# Patient Record
Sex: Female | Born: 1985 | Race: White | Hispanic: No | Marital: Married | Smoking: Never smoker
Health system: Southern US, Community
[De-identification: ages and names within clinical notes are randomized; demographics above are authoritative.]

## PROBLEM LIST (undated history)

## (undated) DIAGNOSIS — I341 Nonrheumatic mitral (valve) prolapse: Secondary | ICD-10-CM

## (undated) DIAGNOSIS — O4400 Placenta previa specified as without hemorrhage, unspecified trimester: Secondary | ICD-10-CM

## (undated) HISTORY — DX: Nonrheumatic mitral (valve) prolapse: I34.1

## (undated) HISTORY — PX: WISDOM TOOTH EXTRACTION: SHX21

---
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
## 2003-11-05 ENCOUNTER — Encounter: Admission: RE | Admit: 2003-11-05 | Discharge: 2003-11-05 | Payer: Self-pay | Admitting: *Deleted

## 2003-12-03 ENCOUNTER — Encounter: Admission: RE | Admit: 2003-12-03 | Discharge: 2003-12-03 | Payer: Self-pay | Admitting: *Deleted

## 2003-12-03 ENCOUNTER — Ambulatory Visit (HOSPITAL_COMMUNITY): Admission: RE | Admit: 2003-12-03 | Discharge: 2003-12-03 | Payer: Self-pay | Admitting: *Deleted

## 2006-04-09 ENCOUNTER — Ambulatory Visit: Payer: Self-pay | Admitting: Internal Medicine

## 2008-12-05 DIAGNOSIS — I059 Rheumatic mitral valve disease, unspecified: Secondary | ICD-10-CM

## 2010-01-07 ENCOUNTER — Encounter (INDEPENDENT_AMBULATORY_CARE_PROVIDER_SITE_OTHER): Payer: Self-pay | Admitting: *Deleted

## 2010-02-21 ENCOUNTER — Ambulatory Visit: Payer: Self-pay | Admitting: Internal Medicine

## 2010-09-30 NOTE — Letter (Signed)
Summary: Appointment - Missed  [facility], Main Office  [address]   [place], [place] [address]   Phone: [phone]  Fax: [phone]     Jan 07, 2010 MRN: [id number]   [facility] Barron [address] [place], [place]  [address]   Dear Traci Barron,  Our records indicate you missed your appointment on 01/03/2010 with Dr.  Tenny Craw. It is very important that we reach you to reschedule this appointment. We look forward to participating in your health care needs. Please contact us at the number listed above at your earliest convenience to reschedule this appointment.     Sincerely,   Traci Barron [facility] Scheduling Team

## 2010-09-30 NOTE — Assessment & Plan Note (Signed)
Summary: f3y   History of Present Illness: Patient is a 25 year old with a history of mitral valve prolapse.  I last saw her in 2007. The patient has done well since.  SHe denies palpitations.  No shortness of breath.  No chest pains.   Current Medications (verified): 1)  Birth Control  Allergies (verified): No Known Drug Allergies  Past History:  Past Medical History: Last updated: 03/14/2009 Current Problems:  MITRAL VALVE PROLAPSE (ICD-424.0)  Review of Systems       All systems reviewed.  Negatvie to the above problem.  Vital Signs:  Patient profile:   25 year old female Weight:      180 pounds Pulse rate:   90 / minute BP sitting:   121 / 76  (left arm) Cuff size:   regular  Vitals Entered By: Burnett Kanaris, CNA (February 21, 2010 4:35 PM)  Physical Exam  Additional Exam:  Patient is in NAD HEENT:  Normocephalic, atraumatic. EOMI, PERRLA.  Neck: JVP is normal. No thyromegaly. No bruits.  Lungs: clear to auscultation. No rales no wheezes.  Heart: Regular rate and rhythm. Normal S1, S2. No S3.   No  murmur. Positive clicks.  Abdomen:  Supple, nontender. Normal bowel sounds. No masses. No hepatomegaly.  Extremities:   Good distal pulses throughout. No lower extremity edema.  Musculoskeletal :moving all extremities.  Neuro:   alert and oriented x3.    EKG  Procedure date:  02/21/2010  Findings:      Ectopic atrial rhythm.  72 bpm.  Impression & Recommendations:  Problem # 1:  MITRAL VALVE PROLAPSE (ICD-424.0) Patient with no murmur to suggest MR.  I would not recommend echocardigram. Instead, revommend continuing to follow physical exam. I encouraged patient to stay active.  In regards to ABX prophylaxis, the ACC no longer recommends it in patients with MVP. Reianna  is normaly followed at [facility] clinc by Dr. Almedia Balls.  She will continue to follow up with him.  If her exam changes and she develops a murmur would reevaluate with echo thne..  Will be  available as needed.  Follow up is as needed.

## 2011-01-16 NOTE — Assessment & Plan Note (Signed)
[facility]                              CARDIOLOGY OFFICE NOTE   ANDROMEDA, POPPEN                       MRN:          [id number]  DATE:04/09/2006                            DOB:          12-14-1985    IDENTIFICATION:  Ms. Traci Barron is a young woman who was previously followed by  Elsie Stain, MD, in pediatric cardiology clinic.  She has a history of  mild mitral valve prolapse.  She was last seen in the clinic back in April  of 2005.  Note echocardiogram  prior to this showed again diagnosis of mild  mitral valve prolapse with trivial regurgitation.  Not clear on the date of  this.  Physical examination at the time showed multiple clicks.  In the  interval, the patient has done well.  She is active, denies palpitations, no  shortness of breath, no chest pain.   CURRENT MEDICATIONS:  None.   PAST MEDICAL HISTORY:  Mild MVP.   REVIEW OF SYSTEMS:  Negative.   PHYSICAL EXAMINATION:  GENERAL APPEARANCE:  Patient is in no distress.  VITAL SIGNS:  Blood pressure 122/84, pulse 67, weight 178.  NECK:  JVP is normal.  LUNGS:  Clear.  CARDIOVASCULAR:  Regular rate and rhythm.  S1 and S2.  No S3 or S4.  Again,  clicks intermittently heard at the apex.  ABDOMEN:  Benign.  EXTREMITIES:  Good distal pulses, equal onset, no edema.   A 12-lead EKG normal sinus rhythm, T-wave inversion in the inferolateral  leads and unchanged from previous.   IMPRESSION:  Mitral valve prolapse minimal, mild by exam, no evidence of  regurgitation.   I would like to review the echocardiogram  that she had done at the  hospital.  She has been taking antibiotic prophylaxis.  I will be in touch  with her once I have reviewed it regarding continued use.   Otherwise tentatively follow up in two years' time, sooner if problems  develop.   Will, at some point, need to get a fasting lipid panel for health care  maintenance.                               Pricilla Riffle,  MD, [facility]    PVR/MedQ  DD:  04/11/2006  DT:  04/12/2006  Job #:  [id number]

## 2015-10-07 ENCOUNTER — Encounter: Payer: Self-pay | Admitting: Internal Medicine

## 2015-11-07 ENCOUNTER — Encounter: Payer: Self-pay | Admitting: Internal Medicine

## 2015-12-17 ENCOUNTER — Encounter: Payer: Self-pay | Admitting: *Deleted

## 2015-12-20 ENCOUNTER — Encounter: Payer: Self-pay | Admitting: Internal Medicine

## 2015-12-24 ENCOUNTER — Encounter: Payer: Self-pay | Admitting: Internal Medicine

## 2018-12-05 ENCOUNTER — Encounter: Payer: Self-pay | Admitting: Obstetrics & Gynecology

## 2018-12-05 DIAGNOSIS — Z3490 Encounter for supervision of normal pregnancy, unspecified, unspecified trimester: Secondary | ICD-10-CM

## 2018-12-05 DIAGNOSIS — O0990 Supervision of high risk pregnancy, unspecified, unspecified trimester: Secondary | ICD-10-CM | POA: Insufficient documentation

## 2018-12-08 ENCOUNTER — Encounter: Payer: Self-pay | Admitting: Obstetrics & Gynecology

## 2018-12-08 ENCOUNTER — Other Ambulatory Visit: Payer: Self-pay

## 2018-12-08 ENCOUNTER — Ambulatory Visit (INDEPENDENT_AMBULATORY_CARE_PROVIDER_SITE_OTHER): Payer: PRIVATE HEALTH INSURANCE | Admitting: Obstetrics & Gynecology

## 2018-12-08 DIAGNOSIS — Z1151 Encounter for screening for human papillomavirus (HPV): Secondary | ICD-10-CM

## 2018-12-08 DIAGNOSIS — Z124 Encounter for screening for malignant neoplasm of cervix: Secondary | ICD-10-CM | POA: Diagnosis not present

## 2018-12-08 DIAGNOSIS — O99211 Obesity complicating pregnancy, first trimester: Secondary | ICD-10-CM | POA: Diagnosis not present

## 2018-12-08 DIAGNOSIS — Z3A09 9 weeks gestation of pregnancy: Secondary | ICD-10-CM | POA: Diagnosis not present

## 2018-12-08 DIAGNOSIS — Z23 Encounter for immunization: Secondary | ICD-10-CM

## 2018-12-08 DIAGNOSIS — Z113 Encounter for screening for infections with a predominantly sexual mode of transmission: Secondary | ICD-10-CM

## 2018-12-08 DIAGNOSIS — Z3490 Encounter for supervision of normal pregnancy, unspecified, unspecified trimester: Secondary | ICD-10-CM

## 2018-12-08 DIAGNOSIS — O99210 Obesity complicating pregnancy, unspecified trimester: Secondary | ICD-10-CM | POA: Insufficient documentation

## 2018-12-08 DIAGNOSIS — O09521 Supervision of elderly multigravida, first trimester: Secondary | ICD-10-CM

## 2018-12-08 DIAGNOSIS — O09529 Supervision of elderly multigravida, unspecified trimester: Secondary | ICD-10-CM | POA: Insufficient documentation

## 2018-12-08 NOTE — Progress Notes (Signed)
Bedside U/S shows single IUP with FHT of 189 BPM and CRL is 24.30 mm  GA is [redacted]w[redacted]d

## 2018-12-08 NOTE — Progress Notes (Signed)
  Subjective:    Traci Barron is being seen today for her first obstetrical visit.  This is a planned pregnancy. She is at [redacted]w[redacted]d gestation. Her obstetrical history is significant for advanced maternal age and obesity. Relationship with FOB: spouse, living together. Patient does intend to breast feed. Pregnancy history fully reviewed.  Patient reports no complaints.  Review of Systems:   Review of Systems  Objective:     BP 116/67   Pulse 80   Ht 5\' 6"  (1.676 m)   Wt 227 lb (103 kg)   LMP 09/27/2018   BMI 36.64 kg/m  Physical Exam  Exam Breathing, conversing, and ambulating normally Well nourished, well hydrated White female, no apparent distress Heart- rrr Breast- normal bilaterally Lungs- CTAB Abd- benign Speculum exam- normal vulva, vagina, cervix   Assessment:    Pregnancy: G1P0 Patient Active Problem List   Diagnosis Date Noted  . Obesity in pregnancy 12/08/2018  . AMA (advanced maternal age) multigravida 35+ 12/08/2018  . Supervision of normal pregnancy 12/05/2018  . MITRAL VALVE PROLAPSE 12/05/2008       Plan:     Initial labs drawn. Prenatal vitamins. Problem list reviewed and updated. NIPS/horizon today Role of ultrasound in pregnancy discussed; fetal survey: ordered. Amniocentesis discussed: not indicated. Follow up in 4 weeks. Flu vaccine Baby scripts, OPX Pap with cotesting Discussed weight gain, she declines a consult with a dietician.   Allie Bossier 12/08/2018

## 2018-12-09 LAB — OBSTETRIC PANEL
Absolute Monocytes: 644 cells/uL (ref 200–950)
Antibody Screen: NOT DETECTED
Basophils Absolute: 20 cells/uL (ref 0–200)
Basophils Relative: 0.2 %
Eosinophils Absolute: 69 cells/uL (ref 15–500)
Eosinophils Relative: 0.7 %
HCT: 40.3 % (ref 35.0–45.0)
Hemoglobin: 14.3 g/dL (ref 11.7–15.5)
Hepatitis B Surface Ag: NONREACTIVE
Lymphs Abs: 1366 cells/uL (ref 850–3900)
MCH: 32.6 pg (ref 27.0–33.0)
MCHC: 35.5 g/dL (ref 32.0–36.0)
MCV: 91.8 fL (ref 80.0–100.0)
MPV: 9.8 fL (ref 7.5–12.5)
Monocytes Relative: 6.5 %
Neutro Abs: 7801 cells/uL — ABNORMAL HIGH (ref 1500–7800)
Neutrophils Relative %: 78.8 %
Platelets: 278 10*3/uL (ref 140–400)
RBC: 4.39 10*6/uL (ref 3.80–5.10)
RDW: 11.7 % (ref 11.0–15.0)
RPR Ser Ql: NONREACTIVE
Rubella: 3.23 index
Total Lymphocyte: 13.8 %
WBC: 9.9 10*3/uL (ref 3.8–10.8)

## 2018-12-09 LAB — COMPREHENSIVE METABOLIC PANEL
AG Ratio: 1.8 (calc) (ref 1.0–2.5)
ALT: 14 U/L (ref 6–29)
AST: 17 U/L (ref 10–30)
Albumin: 4.3 g/dL (ref 3.6–5.1)
Alkaline phosphatase (APISO): 44 U/L (ref 31–125)
BUN: 8 mg/dL (ref 7–25)
CO2: 23 mmol/L (ref 20–32)
Calcium: 9.4 mg/dL (ref 8.6–10.2)
Chloride: 103 mmol/L (ref 98–110)
Creat: 0.71 mg/dL (ref 0.50–1.10)
Globulin: 2.4 g/dL (calc) (ref 1.9–3.7)
Glucose, Bld: 83 mg/dL (ref 65–99)
Potassium: 3.8 mmol/L (ref 3.5–5.3)
Sodium: 136 mmol/L (ref 135–146)
Total Bilirubin: 0.6 mg/dL (ref 0.2–1.2)
Total Protein: 6.7 g/dL (ref 6.1–8.1)

## 2018-12-09 LAB — PROTEIN / CREATININE RATIO, URINE
Creatinine, Urine: 190 mg/dL (ref 20–275)
Protein/Creat Ratio: 84 mg/g creat (ref 21–161)
Protein/Creatinine Ratio: 0.084 mg/mg creat (ref 0.021–0.16)
Total Protein, Urine: 16 mg/dL (ref 5–24)

## 2018-12-09 LAB — HIV ANTIBODY (ROUTINE TESTING W REFLEX): HIV 1&2 Ab, 4th Generation: NONREACTIVE

## 2018-12-09 LAB — HEMOGLOBIN A1C
Hgb A1c MFr Bld: 4.7 % of total Hgb (ref <5.7)
Mean Plasma Glucose: 88 (calc)
eAG (mmol/L): 4.9 (calc)

## 2018-12-10 LAB — URINE CULTURE, OB REFLEX

## 2018-12-10 LAB — CULTURE, OB URINE

## 2018-12-13 LAB — CYTOLOGY - PAP
Chlamydia: NEGATIVE
Diagnosis: NEGATIVE
HPV: NOT DETECTED
Neisseria Gonorrhea: NEGATIVE

## 2019-01-05 ENCOUNTER — Ambulatory Visit (INDEPENDENT_AMBULATORY_CARE_PROVIDER_SITE_OTHER): Payer: PRIVATE HEALTH INSURANCE | Admitting: Obstetrics & Gynecology

## 2019-01-05 ENCOUNTER — Other Ambulatory Visit: Payer: Self-pay

## 2019-01-05 VITALS — BP 126/80 | HR 70 | Wt 226.0 lb

## 2019-01-05 DIAGNOSIS — I059 Rheumatic mitral valve disease, unspecified: Secondary | ICD-10-CM

## 2019-01-05 DIAGNOSIS — O09521 Supervision of elderly multigravida, first trimester: Secondary | ICD-10-CM

## 2019-01-05 DIAGNOSIS — O09522 Supervision of elderly multigravida, second trimester: Secondary | ICD-10-CM

## 2019-01-05 DIAGNOSIS — O99211 Obesity complicating pregnancy, first trimester: Secondary | ICD-10-CM

## 2019-01-05 DIAGNOSIS — Z3402 Encounter for supervision of normal first pregnancy, second trimester: Secondary | ICD-10-CM

## 2019-01-05 DIAGNOSIS — O99210 Obesity complicating pregnancy, unspecified trimester: Secondary | ICD-10-CM

## 2019-01-05 NOTE — Progress Notes (Signed)
   PRENATAL VISIT NOTE  Subjective:  Traci Barron is a 33 y.o. G1P0 at [redacted]w[redacted]d being seen today for ongoing prenatal care.  She is currently monitored for the following issues for this low-risk pregnancy and has Mitral valve disease; Supervision of normal pregnancy; Obesity in pregnancy; and AMA (advanced maternal age) multigravida 35+ on their problem list.  Patient reports no complaints.  Contractions: Not present. Vag. Bleeding: None.  Movement: Absent. Denies leaking of fluid.   The following portions of the patient's history were reviewed and updated as appropriate: allergies, current medications, past family history, past medical history, past social history, past surgical history and problem list.   Objective:   Vitals:   01/05/19 1528  BP: 126/80  Pulse: 70  Weight: 226 lb (102.5 kg)    Fetal Status: Fetal Heart Rate (bpm): 159   Movement: Absent     General:  Alert, oriented and cooperative. Patient is in no acute distress.  Skin: Skin is warm and dry. No rash noted.   Cardiovascular: Normal heart rate noted  Respiratory: Normal respiratory effort, no problems with respiration noted  Abdomen: Soft, gravid, appropriate for gestational age.  Pain/Pressure: Absent     Pelvic: Cervical exam deferred        Extremities: Normal range of motion.  Edema: None  Mental Status: Normal mood and affect. Normal behavior. Normal judgment and thought content.   Assessment and Plan:  Pregnancy: G1P0 at [redacted]w[redacted]d 1. Multigravida of advanced maternal age in second trimester   2. Obesity in pregnancy - rec less than 20 pounds  3. Encounter for supervision of normal first pregnancy in second trimester   4. Mitral valve disease - asymptomatic, discovered during exam for cheerleading in high school  Preterm labor symptoms and general obstetric precautions including but not limited to vaginal bleeding, contractions, leaking of fluid and fetal movement were reviewed in detail with the patient.  Please refer to After Visit Summary for other counseling recommendations.   No follow-ups on file.  Future Appointments  Date Time Provider Department Center  02/20/2019  8:15 AM [facility]-M[facility] [place] 4 WH-MFCUS M[facility]-US    Allie Bossier, MD

## 2019-01-16 ENCOUNTER — Encounter: Payer: Self-pay | Admitting: *Deleted

## 2019-01-16 DIAGNOSIS — Z3402 Encounter for supervision of normal first pregnancy, second trimester: Secondary | ICD-10-CM

## 2019-02-20 ENCOUNTER — Other Ambulatory Visit: Payer: Self-pay | Admitting: Obstetrics & Gynecology

## 2019-02-20 ENCOUNTER — Ambulatory Visit (HOSPITAL_COMMUNITY)
Admission: RE | Admit: 2019-02-20 | Discharge: 2019-02-20 | Disposition: A | Discharge status: Home / Self Care | Payer: PRIVATE HEALTH INSURANCE | Source: Ambulatory Visit | Attending: Obstetrics and Gynecology | Admitting: Obstetrics and Gynecology

## 2019-02-20 ENCOUNTER — Other Ambulatory Visit: Payer: Self-pay

## 2019-02-20 ENCOUNTER — Other Ambulatory Visit (HOSPITAL_COMMUNITY): Payer: Self-pay | Admitting: *Deleted

## 2019-02-20 DIAGNOSIS — Z362 Encounter for other antenatal screening follow-up: Secondary | ICD-10-CM

## 2019-02-20 DIAGNOSIS — O99412 Diseases of the circulatory system complicating pregnancy, second trimester: Secondary | ICD-10-CM | POA: Diagnosis not present

## 2019-02-20 DIAGNOSIS — Z3A19 19 weeks gestation of pregnancy: Secondary | ICD-10-CM

## 2019-02-20 DIAGNOSIS — O289 Unspecified abnormal findings on antenatal screening of mother: Secondary | ICD-10-CM

## 2019-02-20 DIAGNOSIS — Z3490 Encounter for supervision of normal pregnancy, unspecified, unspecified trimester: Secondary | ICD-10-CM | POA: Diagnosis not present

## 2019-02-20 DIAGNOSIS — O3412 Maternal care for benign tumor of corpus uteri, second trimester: Secondary | ICD-10-CM | POA: Diagnosis not present

## 2019-02-20 DIAGNOSIS — O99212 Obesity complicating pregnancy, second trimester: Secondary | ICD-10-CM | POA: Diagnosis not present

## 2019-02-20 DIAGNOSIS — O09522 Supervision of elderly multigravida, second trimester: Secondary | ICD-10-CM | POA: Diagnosis not present

## 2019-02-20 DIAGNOSIS — O4402 Placenta previa specified as without hemorrhage, second trimester: Secondary | ICD-10-CM

## 2019-02-23 ENCOUNTER — Telehealth (INDEPENDENT_AMBULATORY_CARE_PROVIDER_SITE_OTHER): Payer: PRIVATE HEALTH INSURANCE | Admitting: Obstetrics & Gynecology

## 2019-02-23 DIAGNOSIS — Z3402 Encounter for supervision of normal first pregnancy, second trimester: Secondary | ICD-10-CM

## 2019-02-23 DIAGNOSIS — O99210 Obesity complicating pregnancy, unspecified trimester: Secondary | ICD-10-CM

## 2019-02-23 DIAGNOSIS — Z3A20 20 weeks gestation of pregnancy: Secondary | ICD-10-CM

## 2019-02-23 MED ORDER — SODIUM CHLORIDE 0.9 % IV SOLN
10.00 | INTRAVENOUS | Status: DC
Start: ? — End: 2019-02-23

## 2019-02-23 MED ORDER — GENERIC EXTERNAL MEDICATION
Status: DC
Start: ? — End: 2019-02-23

## 2019-02-23 NOTE — Progress Notes (Signed)
TELEHEALTH OBSTETRICS PRENATAL VIRTUAL VIDEO VISIT ENCOUNTER NOTE  [facility] [place]   I connected with Traci Barron on 02/23/19 at  2:15 PM EDT by MyChart Video Encounter at home and verified that I am speaking with the correct person using two identifiers.   I discussed the limitations, risks, security and privacy concerns of performing an evaluation and management service by telephone and the availability of in person appointments. I also discussed with the patient that there may be a patient responsible charge related to this service. The patient expressed understanding and agreed to proceed. Subjective:  Traci LollLyndsey Mich is a 33 y.o. G1P0 at 7230w1d being seen today for ongoing prenatal care.  She is currently monitored for the following issues for this low-risk pregnancy and has Mitral valve disease; Supervision of normal pregnancy; Obesity in pregnancy; and AMA (advanced maternal age) multigravida 35+ on their problem list.  Patient reports no complaints.  Contractions: Not present. Vag. Bleeding: None.  Movement: Absent. Denies any leaking of fluid.   The following portions of the patient's history were reviewed and updated as appropriate: allergies, current medications, past family history, past medical history, past social history, past surgical history and problem list.   Objective:   Vitals:   02/23/19 1430  BP: 120/63  Weight: 227 lb (103 kg)    Fetal Status:     Movement: Absent     General:  Alert, oriented and cooperative. Patient is in no acute distress.  Respiratory: Normal respiratory effort, no problems with respiration noted  Mental Status: Normal mood and affect. Normal behavior. Normal judgment and thought content.  Rest of physical exam deferred due to type of encounter  Imaging: [place] Mfm Ob Detail +14 Wk  Result Date: 02/20/2019 ----------------------------------------------------------------------  OBSTETRICS  REPORT                       (Signed Final 02/20/2019 10:12 am) ---------------------------------------------------------------------- Patient Info  ID #:       [id number]                          D.O.B.:  [birth year] (32 yrs)  Name:       Traci Barron                   Visit Date: 02/20/2019 08:08 am ---------------------------------------------------------------------- Performed By  Performed By:     Rennie PlowmanErica Lyskawa          Ref. Address:     [address]                    [address]                                                             [address]                                                             [place], [place]  [id number]  Attending:        Noralee Spaceavi Shankar MD        [facility]  Referred By:      Allie BossierMYRA C Issaic Welliver MD ---------------------------------------------------------------------- Orders   #  Description                          Code         Ordered By   1  US MFM OB DETAIL +14 WK              76811.01     Annaliesa Blann  ----------------------------------------------------------------------   #  Order #                    Accession #                 Episode #   1  [id number]                  [id number]                  [id number]  ---------------------------------------------------------------------- Indications   [redacted] weeks gestation of pregnancy                Z3A.19   Encounter for antenatal screening for          Z36.3   malformations (NIPS, low-risk female)   Obesity complicating pregnancy, second         72O99.212   trimester   Advanced maternal age multigravida 5335+,        89O09.522   second trimester   Heart disease in mother affecting pregnancy    O99.412   in second trimester (mitral valve prolapse)   Uterine fibroids                               O34.10   Abnormal finding on antenatal screening        O28.9   (shortened long bones)   Placenta previa specified as  without           O44.02   hemorrhage, second trimester  ---------------------------------------------------------------------- Vital Signs  Weight (lb): 216                               Height:        5'6"  BMI:         34.86 ---------------------------------------------------------------------- Fetal Evaluation  Num Of Fetuses:         1  Fetal Heart Rate(bpm):  151  Cardiac Activity:       Observed  Presentation:           Breech  Placenta:               Posterior Previa  P. Cord Insertion:      Visualized, central  Amniotic Fluid  AFI FV:      Within normal limits  Largest Pocket(cm)                              5.64 ---------------------------------------------------------------------- Biometry  BPD:      42.9  mm     G. Age:  19w 0d         21  %    CI:        70.13   %    70 - 86                                                          FL/HC:      14.7   %    16.8 - 19.8  HC:      163.4  mm     G. Age:  19w 1d         17  %    HC/AC:      1.19        1.09 - 1.39  AC:      137.7  mm     G. Age:  19w 1d         28  %    FL/BPD:     55.9   %  FL:         24  mm     G. Age:  17w 1d        < 3  %    FL/AC:      17.4   %    20 - 24  HUM:        24  mm     G. Age:  17w 3d        < 5  %  CER:        20  mm     G. Age:  19w 0d         36  %  NFT:       4.1  mm  LV:          7  mm  CM:        5.3  mm  ULN:      19.7  mm     G. Age:  16w 6d        < 5  %  TIB:      21.2  mm     G. Age:  17w 4d        < 5  %  RAD:      12.9  mm     G. Age:  14w 0d        < 5  %  FIB:      19.4  mm     G. Age:  16w 3d         13  %  Est. FW:     237  gm      0 lb 8 oz   < 25  % ---------------------------------------------------------------------- OB History  Gravidity:    1         Term:   0        Prem:   0        SAB:   0  TOP:  0       Ectopic:  0        Living: 0 ---------------------------------------------------------------------- Gestational Age  LMP:           20w 6d        Date:  09/27/18                  EDD:   07/04/19  U/S Today:     18w 4d                                        EDD:   07/20/19  Best:          19w 5d     Det. ByLoman Chroman         EDD:   07/12/19                                      (12/08/18) ---------------------------------------------------------------------- Anatomy  Cranium:               Appears normal         LVOT:                   Not well visualized  Cavum:                 Not well visualized    Aortic Arch:            Not well visualized  Ventricles:            Appears normal         Ductal Arch:            Not well visualized  Choroid Plexus:        Appears normal         Diaphragm:              Appears normal  Cerebellum:            Appears normal         Stomach:                Appears normal, left                                                                        sided  Posterior Fossa:       Appears normal         Abdomen:                Appears normal  Nuchal Fold:           Appears normal         Abdominal Wall:         Appears nml (cord  insert, abd wall)  Face:                  Appears normal         Cord Vessels:           Appears normal (3                         (orbits and profile)                           vessel cord)  Lips:                  Appears normal         Kidneys:                Appear normal  Palate:                Appears normal         Bladder:                Appears normal  Thoracic:              Appears normal         Spine:                  Ltd views no                                                                        intracranial signs of                                                                        NTD  Heart:                 Not well visualized    Upper Extremities:      Appears normal  RVOT:                  Not well visualized    Lower Extremities:      Appears normal  Other:  Heels and 5th digit visualized. Nasal bone visualized. Fetus appears           to be female. Technically difficult due to fetal position. ---------------------------------------------------------------------- Cervix Uterus Adnexa  Cervix  Length:            4.7  cm.  Normal appearance by transabdominal scan.  Left Ovary  Not visualized.  Right Ovary  Not visualized.  Adnexa  No abnormality visualized. ---------------------------------------------------------------------- Myomas   Site                     L(cm)      W(cm)      D(cm)      Location   Posterior right          6.38       5.9  6.46   Posterior ML             5.98       5.09       4.69   Left lateral             5.05       3.21       3.29  ----------------------------------------------------------------------   Blood Flow                 RI        PI       Comments  ---------------------------------------------------------------------- Impression  Ms. Severe, G1 P0, is here for fetal anatomy scan. On cell-free  fetal DNA screening, the risks of fetal aneuploidies are not  increased (from your note in EPIC).  Patient has mitral valve prolapse and does not have  cardiovascular symptoms.  Her pregnancy is dated by 9-week ultrasound performed at  your office (more than 8 days from her LMP date).  We performed a fetal anatomy scan. No markers of  aneuploidies or fetal structural defects are seen. Fetal  biometry lags gestational age by 8 days and the long bones  (humerus and femur lag gestational age by 2 weeks). The  skeletal system including spine and calvarium look normal.  Bones are well mineralized. The chest appears normal.   Amniotic fluid is normal and good fetal activity is seen.  Placenta previa is seen. 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  I explained that the finding of short long bones is more-likely  to be constitutional. Incorrect gestational age is unlikely when  it is dated by 9-week ultrasound. We, however, do not have  ultrasound images to verify accuracy of the images (bedside).  I reassured her  that placenta previa can resolve in many  cases as the pregnancy advances. I did not recommend  abstinence from sexual intercourse in the absence of vaginal  bleeding.  Patient understands the limitations of ultrasound in detecting  fetal anomalies. ---------------------------------------------------------------------- Recommendations  An appointment was made for her to return in 4 weeks for  completion of fetal anatomy. ----------------------------------------------------------------------                  Noralee Space, MD Electronically Signed Final Report   02/20/2019 10:12 am ----------------------------------------------------------------------   Assessment and Plan:  Pregnancy: G1P0 at [redacted]w[redacted]d 1. Obesity in pregnancy  2. Encounter for supervision of normal first pregnancy in second trimester - She went to [facility] for cramping last week, reassurance was given by them, she was given macrobid for a mild UTI, she is feeling better now - PTL precautions given  Preterm labor symptoms and general obstetric precautions including but not limited to vaginal bleeding, contractions, leaking of fluid and fetal movement were reviewed in detail with the patient. I discussed the assessment and treatment plan with the patient. The patient was provided an opportunity to ask questions and all were answered. The patient agreed with the plan and demonstrated an understanding of the instructions. The patient was advised to call back or seek an in-person office evaluation/go to MAU at [facility] for any urgent or concerning symptoms. Please refer to After Visit Summary for other counseling recommendations.   I provided 10 minutes of face-to-face time during this encounter.  No follow-ups on file.  Future Appointments  Date Time Provider Department Center  03/09/2019  3:15 PM Allie Bossier, MD [facility]  03/20/2019  7:45 AM WH-MFC NURSE [facility]-MFC M[facility]-US  03/20/2019  7:45 AM WH-MFC [place] 2  [facility] M[facility]-US    Allie Bossier, MD Center for [employer], [facility]

## 2019-02-23 NOTE — Progress Notes (Signed)
PT c/o cramping Pt recently around someone who tested positive for COVID

## 2019-03-08 ENCOUNTER — Encounter

## 2019-03-09 ENCOUNTER — Encounter: Payer: PRIVATE HEALTH INSURANCE | Admitting: Obstetrics & Gynecology

## 2019-03-20 ENCOUNTER — Other Ambulatory Visit (HOSPITAL_COMMUNITY): Payer: Self-pay | Admitting: *Deleted

## 2019-03-20 ENCOUNTER — Encounter (HOSPITAL_COMMUNITY): Payer: Self-pay

## 2019-03-20 ENCOUNTER — Ambulatory Visit (HOSPITAL_COMMUNITY): Payer: PRIVATE HEALTH INSURANCE | Admitting: *Deleted

## 2019-03-20 ENCOUNTER — Ambulatory Visit (HOSPITAL_COMMUNITY)
Admission: RE | Admit: 2019-03-20 | Discharge: 2019-03-20 | Disposition: A | Discharge status: Home / Self Care | Payer: PRIVATE HEALTH INSURANCE | Source: Ambulatory Visit | Attending: Obstetrics and Gynecology | Admitting: Obstetrics and Gynecology

## 2019-03-20 ENCOUNTER — Other Ambulatory Visit: Payer: Self-pay

## 2019-03-20 VITALS — BP 118/74 | HR 81 | Temp 98.9°F

## 2019-03-20 DIAGNOSIS — O99210 Obesity complicating pregnancy, unspecified trimester: Secondary | ICD-10-CM

## 2019-03-20 DIAGNOSIS — O99212 Obesity complicating pregnancy, second trimester: Secondary | ICD-10-CM

## 2019-03-20 DIAGNOSIS — Z3A23 23 weeks gestation of pregnancy: Secondary | ICD-10-CM

## 2019-03-20 DIAGNOSIS — O4402 Placenta previa specified as without hemorrhage, second trimester: Secondary | ICD-10-CM

## 2019-03-20 DIAGNOSIS — O09523 Supervision of elderly multigravida, third trimester: Secondary | ICD-10-CM

## 2019-03-20 DIAGNOSIS — O09522 Supervision of elderly multigravida, second trimester: Secondary | ICD-10-CM

## 2019-03-20 DIAGNOSIS — O99412 Diseases of the circulatory system complicating pregnancy, second trimester: Secondary | ICD-10-CM

## 2019-03-20 DIAGNOSIS — O289 Unspecified abnormal findings on antenatal screening of mother: Secondary | ICD-10-CM

## 2019-03-20 DIAGNOSIS — Z362 Encounter for other antenatal screening follow-up: Secondary | ICD-10-CM

## 2019-03-20 DIAGNOSIS — O3412 Maternal care for benign tumor of corpus uteri, second trimester: Secondary | ICD-10-CM

## 2019-03-24 ENCOUNTER — Other Ambulatory Visit: Payer: Self-pay

## 2019-03-24 ENCOUNTER — Encounter: Payer: Self-pay | Admitting: Certified Nurse Midwife

## 2019-03-24 ENCOUNTER — Telehealth (INDEPENDENT_AMBULATORY_CARE_PROVIDER_SITE_OTHER): Payer: PRIVATE HEALTH INSURANCE | Admitting: Certified Nurse Midwife

## 2019-03-24 VITALS — BP 110/76 | HR 82 | Wt 233.0 lb

## 2019-03-24 DIAGNOSIS — O4400 Placenta previa specified as without hemorrhage, unspecified trimester: Secondary | ICD-10-CM

## 2019-03-24 DIAGNOSIS — O283 Abnormal ultrasonic finding on antenatal screening of mother: Secondary | ICD-10-CM

## 2019-03-24 DIAGNOSIS — I059 Rheumatic mitral valve disease, unspecified: Secondary | ICD-10-CM

## 2019-03-24 DIAGNOSIS — O4402 Placenta previa specified as without hemorrhage, second trimester: Secondary | ICD-10-CM

## 2019-03-24 DIAGNOSIS — I341 Nonrheumatic mitral (valve) prolapse: Secondary | ICD-10-CM

## 2019-03-24 DIAGNOSIS — O26892 Other specified pregnancy related conditions, second trimester: Secondary | ICD-10-CM

## 2019-03-24 DIAGNOSIS — Z3A24 24 weeks gestation of pregnancy: Secondary | ICD-10-CM

## 2019-03-24 DIAGNOSIS — Z3402 Encounter for supervision of normal first pregnancy, second trimester: Secondary | ICD-10-CM

## 2019-03-24 HISTORY — DX: Complete placenta previa nos or without hemorrhage, unspecified trimester: O44.00

## 2019-03-24 NOTE — Patient Instructions (Signed)
Media Information

## 2019-03-24 NOTE — Progress Notes (Addendum)
TELEHEALTH OBSTETRICS PRENATAL VIRTUAL VIDEO VISIT ENCOUNTER NOTE  Provider [facility]: [facility] [place]   I connected with Traci LollLyndsey Stemen on 03/24/19 at  9:45 AM EDT by MyChart Video Encounter at home and verified that I am speaking with the correct person using two identifiers.   I discussed the limitations, risks, security and privacy concerns of performing an evaluation and management service virtually and the availability of in person appointments. I also discussed with the patient that there may be a patient responsible charge related to this service. The patient expressed understanding and agreed to proceed. Subjective:  Traci LollLyndsey Boye is a 33 y.o. G1P0 at 2133w2d being seen today for ongoing prenatal care.  She is currently monitored for the following issues for this low-risk pregnancy and has Mitral valve disease; Supervision of normal pregnancy; Obesity in pregnancy; and AMA (advanced maternal age) multigravida 35+ on their problem list.  Patient reports no complaints.  Contractions: Not present. Vag. Bleeding: None.  Movement: Present. Denies any leaking of fluid.   The following portions of the patient's history were reviewed and updated as appropriate: allergies, current medications, past family history, past medical history, past social history, past surgical history and problem list.   Objective:   Vitals:   03/24/19 0905  BP: 110/76  Pulse: 82  Weight: 233 lb (105.7 kg)    Fetal Status:     Movement: Present     General:  Alert, oriented and cooperative. Patient is in no acute distress.  Respiratory: Normal respiratory effort, no problems with respiration noted  Mental Status: Normal mood and affect. Normal behavior. Normal judgment and thought content.  Rest of physical exam deferred due to type of encounter  Imaging: Koreas Mfm Ob Follow Up  Result Date: 03/20/2019 ----------------------------------------------------------------------   OBSTETRICS REPORT                       (Signed Final 03/20/2019 10:38 am) ---------------------------------------------------------------------- Patient Info  ID #:       [id number]                          D.O.B.:  1985/11/21 (32 yrs)  Name:       Traci Barron                   Visit Date: 03/20/2019 08:22 am ---------------------------------------------------------------------- Performed By  Performed By:     Eden Lathearrie Stalter BS      Ref. Address:     [address]                    [facility] [address]T                                                             [address]                                                             [place], [place]  [address]  Attending:        Noralee Space MD        [facility]  Referred By:      Allie Bossier MD ---------------------------------------------------------------------- Orders   #  Description                          Code         Ordered By   1  [place] MFM OB FOLLOW UP                  13086.57     Noralee Space  ----------------------------------------------------------------------   #  Order #                    Accession #                 Episode #   1  [id number]                  [id number]                  [id number]  ---------------------------------------------------------------------- Indications   Encounter for other antenatal screening        Z36.2   follow-up   Obesity complicating pregnancy, second         O99.212   trimester   Advanced maternal age multigravida 52+,        O77.522   second trimester   Heart disease in mother affecting pregnancy    O99.412   in second trimester (mitral valve prolapse)   Uterine fibroids                               O34.10   Abnormal finding on antenatal screening        O28.9   (shortened long bones)   Placenta previa specified as without           O44.02   hemorrhage, second trimester   [redacted] weeks  gestation of pregnancy                Z3A.23  ---------------------------------------------------------------------- Vital Signs                                                 Height:        5'6" ---------------------------------------------------------------------- Fetal Evaluation  Num Of Fetuses:         1  Fetal Heart Rate(bpm):  164  Cardiac Activity:       Observed  Presentation:           Variable  Placenta:               Posterior Previa  P. Cord Insertion:      Previously Visualized  Amniotic Fluid  AFI FV:      Within normal limits  Largest Pocket(cm)                              6.3 ---------------------------------------------------------------------- Biometry  BPD:      57.6  mm     G. Age:  23w 4d         41  %    CI:         70.5   %    70 - 86                                                          FL/HC:      15.8   %    18.7 - 20.9  HC:      218.7  mm     G. Age:  23w 6d         40  %    HC/AC:      1.19        1.05 - 1.21  AC:      184.5  mm     G. Age:  23w 2d         27  %    FL/BPD:     60.1   %    71 - 87  FL:       34.6  mm     G. Age:  20w 6d        < 1  %    FL/AC:      18.8   %    20 - 24  HUM:        34  mm     G. Age:  21w 4d        < 5  %  CER:        24  mm     G. Age:  22w 0d         16  %  CM:        5.3  mm  Est. FW:     504  gm      1 lb 2 oz      5  % ---------------------------------------------------------------------- OB History  Gravidity:    1         Term:   0        Prem:   0        SAB:   0  TOP:          0       Ectopic:  0        Living: 0 ---------------------------------------------------------------------- Gestational Age  LMP:           24w 6d        Date:  09/27/18                 EDD:   07/04/19  U/S Today:     22w 6d                                        EDD:   07/18/19  Best:          23w 5d     Det.  ByLoman Chroman         EDD:   07/12/19                                      (12/08/18)  ---------------------------------------------------------------------- Anatomy  Cranium:               Appears normal         LVOT:                   Appears normal  Cavum:                 Appears normal         Aortic Arch:            Appears normal  Ventricles:            Appears normal         Ductal Arch:            Appears normal  Choroid Plexus:        Appears normal         Diaphragm:              Previously seen  Cerebellum:            Appears normal         Stomach:                Appears normal, left                                                                        sided  Posterior Fossa:       Appears normal         Abdomen:                Appears normal  Nuchal Fold:           Appears normal         Abdominal Wall:         Appears nml (cord                                                                        insert, abd wall)  Face:                  Appears normal         Cord Vessels:           Appears normal (3                         (orbits and profile)                           vessel cord)  Lips:  Appears normal         Kidneys:                Appear normal  Palate:                Previously seen        Bladder:                Appears normal  Thoracic:              Appears normal         Spine:                  Appears normal  Heart:                 Appears normal         Upper Extremities:      Previously seen                         (4CH, axis, and                         situs)  RVOT:                  Not well visualized    Lower Extremities:      Previously seen  Other:  Heels and 5th digits previously visualized. Nasal bone visualized.          Technically difficult due to fetal position. ---------------------------------------------------------------------- Cervix Uterus Adnexa  Cervix  Length:              4  cm.  Normal appearance by transabdominal scan.  Uterus  Multiple fibroids noted, see table below.  Left Ovary  Not visualized.  Right Ovary  Within normal limits.   Cul De Sac  No free fluid seen.  Adnexa  No abnormality visualized. ---------------------------------------------------------------------- Myomas   Site                     L(cm)      W(cm)      D(cm)      Location   Posterior                6.7        5.6        5.9   Left                     4.5        4          3.1  ----------------------------------------------------------------------   Blood Flow                 RI        PI       Comments  ---------------------------------------------------------------------- Impression  Patient returned for completion of fetal anatomy. On previous  ultrasound, placenta previa and short long bones were seen.  Her pregnancy is dated by 9-week ultrasound. Patient does  not give history of vaginal bleeding.  Fetal biometry is consistent with her previously-established  dates. Amniotic fluid is normal and good fetal activity is seen.  Femur lags gestational by 3 weeks and humerus by 2 weeks.  The skull, spine, profile and chest appear normal. On  transabdominal scan, placenta previa (posterior and slightly  covering internal os) is seen.  Two myomas were measured (see above).  I reassured  the patient that although long bones appear  short, good interval growth is seen. The likelihood of skeletal  dysplasia is very low. I informed her that it is more-likely to be  constitutional. I informed her that since femur length affects  fetal weight, the diagnosis of fetal growth restriction may be  made on future scans and more-frequent monitoring may be  necessary. ---------------------------------------------------------------------- Recommendations  An appointment was made for her to return in 4 weeks for  fetal growth assessment and complete cardiac anatomy. ----------------------------------------------------------------------                  Noralee Space, MD Electronically Signed Final Report   03/20/2019 10:38 am  ----------------------------------------------------------------------   Assessment and Plan:  Pregnancy: G1P0 at [redacted]w[redacted]d 1. Mitral valve prolapse - last appt with cards in 2017 - Ambulatory referral to Cardiology  2. Encounter for supervision of normal first pregnancy in second trimester  3. Placenta previa - posterior previa - no VB  4. Abnormal fetal US - short long bones - ?const small - normal interval growth - f/u US scheduled  Preterm labor symptoms and general obstetric precautions including but not limited to vaginal bleeding, contractions, leaking of fluid and fetal movement were reviewed in detail with the patient. I discussed the assessment and treatment plan with the patient. The patient was provided an opportunity to ask questions and all were answered. The patient agreed with the plan and demonstrated an understanding of the instructions. The patient was advised to call back or seek an in-person office evaluation/go to [facility] at [facility] for any urgent or concerning symptoms. Please refer to After Visit Summary for other counseling recommendations.   I provided 15 minutes of face-to-face time during this encounter.  Return in about 4 weeks (around 04/21/2019).  Future Appointments  Date Time Provider Department Center  04/18/2019  7:40 AM [facility] NURSE [facility]-US  04/18/2019  7:45 AM [facility]-MFC [place] 2 WH-MFCUS MFC-US  04/24/2019  8:15 AM Allie Bossier, MD [facility]    Donette Larry, CNM [facility] [employer], [facility]

## 2019-04-18 ENCOUNTER — Other Ambulatory Visit: Payer: Self-pay

## 2019-04-18 ENCOUNTER — Ambulatory Visit (HOSPITAL_COMMUNITY)
Admission: RE | Admit: 2019-04-18 | Discharge: 2019-04-18 | Disposition: A | Discharge status: Home / Self Care | Payer: PRIVATE HEALTH INSURANCE | Source: Ambulatory Visit | Attending: Obstetrics and Gynecology | Admitting: Obstetrics and Gynecology

## 2019-04-18 ENCOUNTER — Other Ambulatory Visit (HOSPITAL_COMMUNITY): Payer: Self-pay | Admitting: Obstetrics and Gynecology

## 2019-04-18 ENCOUNTER — Other Ambulatory Visit (HOSPITAL_COMMUNITY): Payer: Self-pay | Admitting: *Deleted

## 2019-04-18 ENCOUNTER — Encounter (HOSPITAL_COMMUNITY): Payer: Self-pay

## 2019-04-18 ENCOUNTER — Ambulatory Visit (HOSPITAL_BASED_OUTPATIENT_CLINIC_OR_DEPARTMENT_OTHER): Payer: PRIVATE HEALTH INSURANCE | Admitting: *Deleted

## 2019-04-18 ENCOUNTER — Ambulatory Visit (HOSPITAL_COMMUNITY): Payer: PRIVATE HEALTH INSURANCE | Admitting: *Deleted

## 2019-04-18 DIAGNOSIS — O99210 Obesity complicating pregnancy, unspecified trimester: Secondary | ICD-10-CM

## 2019-04-18 DIAGNOSIS — O3412 Maternal care for benign tumor of corpus uteri, second trimester: Secondary | ICD-10-CM

## 2019-04-18 DIAGNOSIS — O4402 Placenta previa specified as without hemorrhage, second trimester: Secondary | ICD-10-CM | POA: Diagnosis present

## 2019-04-18 DIAGNOSIS — O365990 Maternal care for other known or suspected poor fetal growth, unspecified trimester, not applicable or unspecified: Secondary | ICD-10-CM

## 2019-04-18 DIAGNOSIS — Z362 Encounter for other antenatal screening follow-up: Secondary | ICD-10-CM | POA: Diagnosis not present

## 2019-04-18 DIAGNOSIS — O09522 Supervision of elderly multigravida, second trimester: Secondary | ICD-10-CM | POA: Diagnosis not present

## 2019-04-18 DIAGNOSIS — Z3A27 27 weeks gestation of pregnancy: Secondary | ICD-10-CM

## 2019-04-18 DIAGNOSIS — O289 Unspecified abnormal findings on antenatal screening of mother: Secondary | ICD-10-CM

## 2019-04-18 DIAGNOSIS — O283 Abnormal ultrasonic finding on antenatal screening of mother: Secondary | ICD-10-CM | POA: Insufficient documentation

## 2019-04-18 DIAGNOSIS — O99412 Diseases of the circulatory system complicating pregnancy, second trimester: Secondary | ICD-10-CM | POA: Diagnosis not present

## 2019-04-18 DIAGNOSIS — O365920 Maternal care for other known or suspected poor fetal growth, second trimester, not applicable or unspecified: Secondary | ICD-10-CM

## 2019-04-18 DIAGNOSIS — O99212 Obesity complicating pregnancy, second trimester: Secondary | ICD-10-CM | POA: Diagnosis not present

## 2019-04-18 NOTE — Procedures (Signed)
Traci Barron [birth year] [redacted]w[redacted]d  Fetus A Non-Stress Test Interpretation for 04/18/19  Indication: IUGR  Fetal Heart Rate A Mode: External Baseline Rate (A): 145 bpm Variability: Moderate Accelerations: 10 x 10 Decelerations: None Multiple birth?: No  Uterine Activity Mode: Palpation, Toco Contraction Frequency (min): None Resting Tone Palpated: Relaxed Resting Time: Adequate  Interpretation (Fetal Testing) Nonstress Test Interpretation: Reactive(for [redacted]w[redacted]d) Overall Impression: Reassuring for gestational age Comments: EFM tracing reviewed by Dr. Donalee Citrin

## 2019-04-24 ENCOUNTER — Encounter

## 2019-04-24 ENCOUNTER — Encounter: Payer: Self-pay | Admitting: Internal Medicine

## 2019-04-24 ENCOUNTER — Ambulatory Visit (INDEPENDENT_AMBULATORY_CARE_PROVIDER_SITE_OTHER): Payer: PRIVATE HEALTH INSURANCE | Admitting: Obstetrics & Gynecology

## 2019-04-24 ENCOUNTER — Other Ambulatory Visit: Payer: Self-pay

## 2019-04-24 ENCOUNTER — Ambulatory Visit: Payer: PRIVATE HEALTH INSURANCE | Admitting: Internal Medicine

## 2019-04-24 ENCOUNTER — Other Ambulatory Visit: Payer: Self-pay | Admitting: *Deleted

## 2019-04-24 VITALS — BP 130/84 | HR 77 | Wt 242.0 lb

## 2019-04-24 VITALS — BP 132/86 | HR 74 | Ht 66.0 in | Wt 244.0 lb

## 2019-04-24 DIAGNOSIS — Z3402 Encounter for supervision of normal first pregnancy, second trimester: Secondary | ICD-10-CM

## 2019-04-24 DIAGNOSIS — I059 Rheumatic mitral valve disease, unspecified: Secondary | ICD-10-CM | POA: Diagnosis not present

## 2019-04-24 DIAGNOSIS — O360130 Maternal care for anti-D [Rh] antibodies, third trimester, not applicable or unspecified: Secondary | ICD-10-CM

## 2019-04-24 DIAGNOSIS — Z23 Encounter for immunization: Secondary | ICD-10-CM

## 2019-04-24 DIAGNOSIS — O09523 Supervision of elderly multigravida, third trimester: Secondary | ICD-10-CM

## 2019-04-24 DIAGNOSIS — O4403 Placenta previa specified as without hemorrhage, third trimester: Secondary | ICD-10-CM

## 2019-04-24 DIAGNOSIS — Z3A28 28 weeks gestation of pregnancy: Secondary | ICD-10-CM | POA: Diagnosis not present

## 2019-04-24 DIAGNOSIS — O365930 Maternal care for other known or suspected poor fetal growth, third trimester, not applicable or unspecified: Secondary | ICD-10-CM | POA: Diagnosis not present

## 2019-04-24 DIAGNOSIS — O99213 Obesity complicating pregnancy, third trimester: Secondary | ICD-10-CM

## 2019-04-24 DIAGNOSIS — O99210 Obesity complicating pregnancy, unspecified trimester: Secondary | ICD-10-CM

## 2019-04-24 DIAGNOSIS — O4402 Placenta previa specified as without hemorrhage, second trimester: Secondary | ICD-10-CM

## 2019-04-24 MED ORDER — BETAMETHASONE SOD PHOS & ACET 6 (3-3) MG/ML IJ SUSP
12.0000 mg | Freq: Once | INTRAMUSCULAR | Status: AC
  Administered 2019-04-24: 11:00:00 12 mg via INTRAMUSCULAR

## 2019-04-24 MED ORDER — RHO D IMMUNE GLOBULIN 1500 UNIT/2ML IJ SOSY
300.0000 ug | PREFILLED_SYRINGE | Freq: Once | INTRAMUSCULAR | Status: AC
  Administered 2019-04-24: 300 ug via INTRAMUSCULAR

## 2019-04-24 NOTE — Progress Notes (Addendum)
Cardiology Office Note   Date:  04/24/2019   ID:  Traci Barron, DOB 02-Dec-1985, MRN [id number]  PCP:  Jonathon Bellows, PA-C  Cardiologist:   Dorris Carnes, MD   Pt referred for MVP    History of Present Illness: Traci Barron is a 33 y.o. female with a history of MVP  She was followed by Despina Pole while a child  I saw her in the a couple times in follow up, last in 2011   Echoes were done in past but remote The pt is now [redacted] wks pregnant   First pregnancy   Pregnancy is going well    She notes some SOB with acyivity  No CP   No palpitations       Current Meds  Medication Sig  . Prenatal Vit-Fe Fumarate-FA (PRENATAL MULTIVITAMIN) TABS tablet Take 1 tablet by mouth daily at 12 noon.     Allergies:   Patient has no known allergies.   Past Medical History:  Diagnosis Date  . MVP (mitral valve prolapse)     Past Surgical History:  Procedure Laterality Date  . WISDOM TOOTH EXTRACTION       Social History:  The patient  reports that she has never smoked. She has never used smokeless tobacco. She reports previous alcohol use. She reports that she does not use drugs.   Family History:  The patient's family history includes Cancer in her maternal grandfather; Throat cancer in her paternal grandfather.    ROS:  Please see the history of present illness. All other systems are reviewed and  Negative to the above problem except as noted.    PHYSICAL EXAM: VS:  BP 132/86   Pulse 74   Ht 5\' 6"  (1.676 m)   Wt 244 lb (110.7 kg)   LMP 09/27/2018   BMI 39.38 kg/m   GEN: Morbidly obese 33 yo, in no acute distress  HEENT: normal  Neck: no JVD, carotid bruits, or masses Cardiac: RRR; no murmurs, rubs, or gallops,no edema  Respiratory:  clear to auscultation bilaterally, normal work of breathing GI: soft, nontender  Distended, + BS  No hepatomegaly  MS: no deformity Moving all extremities   Skin: warm and dry, no rash Neuro:  Strength and sensation are intact Psych:  euthymic mood, full affect   EKG:  EKG is ordered today.SR 74 bpm   Nonspecific ST T wave changes    Lipid Panel No results found for: CHOL, TRIG, HDL, CHOLHDL, VLDL, LDLCALC, LDLDIRECT    Wt Readings from Last 3 Encounters:  04/24/19 244 lb (110.7 kg)  04/24/19 242 lb (109.8 kg)  03/24/19 233 lb (105.7 kg)      ASSESSMENT AND PLAN:  1.  MVP    Pt has diagonsis of MVP   Previous echo reports not available for review, too long ago, not electronic    Her exam is unremarkable  No murmur or clinic   However, exam is limited by patients size/body habitus  Given Dx I would recomm one more echo to redefine MV structure / function I would not treat differently from now  From a cardiac standpoint, she should do well with the remainder of pregnancy and delivery.    Follow up based on test results.  2 Blood pressure   Diastolic is a little high   Will need to be followed  3   Morbid obesity   Pt needs to lose wt   Should review after delivers.  Current medicines are reviewed at length with the patient today.  The patient does not have concerns regarding medicines.  Signed, Dietrich PatesPaula Issabelle Mcraney, MD  04/24/2019 3:19 PM    [facility] [address], [place], [place]  [address] Phone: [phone]; Fax: [phone]

## 2019-04-24 NOTE — Patient Instructions (Signed)
Medication Instructions:  No changes If you need a refill on your cardiac medications before your next appointment, please call your pharmacy.   Lab work: none If you have labs (blood work) drawn today and your tests are completely normal, you will receive your results only by: Marland Kitchen MyChart Message (if you have MyChart) OR . A paper copy in the mail If you have any lab test that is abnormal or we need to change your treatment, we will call you to review the results.  Testing/Procedures: Your physician has requested that you have an echocardiogram. Echocardiography is a painless test that uses sound waves to create images of your heart. It provides your doctor with information about the size and shape of your heart and how well your heart's chambers and valves are working. This procedure takes approximately one hour. There are no restrictions for this procedure.   Follow-Up: Follow up with your physician will depend on test results.  Any Other Special Instructions Will Be Listed Below (If Applicable).

## 2019-04-24 NOTE — Progress Notes (Signed)
   PRENATAL VISIT NOTE  Subjective:  Traci Barron is a 33 y.o. G1P0 at [redacted]w[redacted]d being seen today for ongoing prenatal care.  She is currently monitored for the following issues for this high-risk pregnancy and has Mitral valve disease; Supervision of normal pregnancy; Obesity in pregnancy; AMA (advanced maternal age) multigravida 35+; Placenta previa; Abnormal fetal ultrasound; and Pregnancy affected by fetal growth restriction on their problem list.  Patient reports no complaints.  Contractions: Not present. Vag. Bleeding: None.  Movement: Present. Denies leaking of fluid.   The following portions of the patient's history were reviewed and updated as appropriate: allergies, current medications, past family history, past medical history, past social history, past surgical history and problem list.   Objective:   Vitals:   04/24/19 0808  BP: 130/84  Pulse: 77  Weight: 242 lb (109.8 kg)    Fetal Status:     Movement: Present     General:  Alert, oriented and cooperative. Patient is in no acute distress.  Skin: Skin is warm and dry. No rash noted.   Cardiovascular: Normal heart rate noted  Respiratory: Normal respiratory effort, no problems with respiration noted  Abdomen: Soft, gravid, appropriate for gestational age.  Pain/Pressure: Absent     Pelvic: Cervical exam deferred        Extremities: Normal range of motion.  Edema: Trace  Mental Status: Normal mood and affect. Normal behavior. Normal judgment and thought content.   Assessment and Plan:  Pregnancy: G1P0 at [redacted]w[redacted]d 1. Encounter for supervision of normal first pregnancy in second trimester - labs, rhogam, tdap today  2. Obesity in pregnancy - offered nutrition consult  3. Placenta previa in second trimester   4. Multigravida of advanced maternal age in third trimester - normal NIPS 5. Fetal growth restriction - weekly MFM dopplers and BPP - rec BMZ, starting tomorrow  Preterm labor symptoms and general obstetric  precautions including but not limited to vaginal bleeding, contractions, leaking of fluid and fetal movement were reviewed in detail with the patient. Please refer to After Visit Summary for other counseling recommendations.   No follow-ups on file.  Future Appointments  Date Time Provider [place]  04/24/2019  3:00 PM Fay Records, MD [facility]  04/25/2019  8:00 AM WH-MFC [place] 3 WH-MFCUS MFC-US  04/25/2019  8:15 AM WH-MFC NURSE [place] MFC-US  04/25/2019  8:45 AM WH-MFC [place] MFC-US  04/25/2019  9:00 AM WH-MFC LAB WH-MFC MFC-US  04/26/2019  9:00 AM WH-MFC LAB WH-MFC MFC-US  04/28/2019  8:30 AM WH-MFC NURSE WH-MFC MFC-US  04/28/2019  8:30 AM WH-MFC [place] 1 WH-MFCUS MFC-US  05/02/2019  8:45 AM WH-MFC NURSE WH-MFC MFC-US  05/02/2019  8:45 AM WH-MFC [place] 2 WH-MFCUS MFC-US  05/02/2019  9:00 AM WH-MFC LAB WH-MFC MFC-US  05/02/2019  9:30 AM WH-MFC NST [place] MFC-US  05/05/2019  8:30 AM WH-MFC NURSE [place] MFC-US  05/05/2019  8:30 AM WH-MFC [place] 1 WH-MFCUS MFC-US    Emily Filbert, MD

## 2019-04-25 ENCOUNTER — Ambulatory Visit (HOSPITAL_COMMUNITY)
Admission: RE | Admit: 2019-04-25 | Discharge: 2019-04-25 | Disposition: A | Discharge status: Home / Self Care | Payer: PRIVATE HEALTH INSURANCE | Source: Ambulatory Visit | Attending: Obstetrics and Gynecology | Admitting: Obstetrics and Gynecology

## 2019-04-25 ENCOUNTER — Encounter (HOSPITAL_COMMUNITY): Payer: Self-pay

## 2019-04-25 ENCOUNTER — Ambulatory Visit (HOSPITAL_COMMUNITY): Payer: PRIVATE HEALTH INSURANCE

## 2019-04-25 ENCOUNTER — Ambulatory Visit (HOSPITAL_COMMUNITY): Payer: PRIVATE HEALTH INSURANCE | Admitting: *Deleted

## 2019-04-25 ENCOUNTER — Ambulatory Visit: Payer: PRIVATE HEALTH INSURANCE

## 2019-04-25 DIAGNOSIS — O99213 Obesity complicating pregnancy, third trimester: Secondary | ICD-10-CM

## 2019-04-25 DIAGNOSIS — O283 Abnormal ultrasonic finding on antenatal screening of mother: Secondary | ICD-10-CM

## 2019-04-25 DIAGNOSIS — O99210 Obesity complicating pregnancy, unspecified trimester: Secondary | ICD-10-CM | POA: Insufficient documentation

## 2019-04-25 DIAGNOSIS — O365990 Maternal care for other known or suspected poor fetal growth, unspecified trimester, not applicable or unspecified: Secondary | ICD-10-CM | POA: Diagnosis not present

## 2019-04-25 DIAGNOSIS — Z3A28 28 weeks gestation of pregnancy: Secondary | ICD-10-CM

## 2019-04-25 DIAGNOSIS — O09523 Supervision of elderly multigravida, third trimester: Secondary | ICD-10-CM

## 2019-04-25 DIAGNOSIS — O99413 Diseases of the circulatory system complicating pregnancy, third trimester: Secondary | ICD-10-CM | POA: Diagnosis not present

## 2019-04-25 DIAGNOSIS — O289 Unspecified abnormal findings on antenatal screening of mother: Secondary | ICD-10-CM

## 2019-04-25 DIAGNOSIS — O4403 Placenta previa specified as without hemorrhage, third trimester: Secondary | ICD-10-CM

## 2019-04-25 DIAGNOSIS — O365930 Maternal care for other known or suspected poor fetal growth, third trimester, not applicable or unspecified: Secondary | ICD-10-CM

## 2019-04-25 DIAGNOSIS — O3413 Maternal care for benign tumor of corpus uteri, third trimester: Secondary | ICD-10-CM

## 2019-04-25 LAB — CBC
HCT: 38.3 % (ref 35.0–45.0)
Hemoglobin: 13 g/dL (ref 11.7–15.5)
MCH: 32.2 pg (ref 27.0–33.0)
MCHC: 33.9 g/dL (ref 32.0–36.0)
MCV: 94.8 fL (ref 80.0–100.0)
MPV: 10.1 fL (ref 7.5–12.5)
Platelets: 305 10*3/uL (ref 140–400)
RBC: 4.04 10*6/uL (ref 3.80–5.10)
RDW: 13.2 % (ref 11.0–15.0)
WBC: 9.7 10*3/uL (ref 3.8–10.8)

## 2019-04-25 LAB — 2HR GTT W 1 HR, CARPENTER, 75 G
Glucose, 1 Hr, Gest: 120 mg/dL (ref 65–179)
Glucose, 2 Hr, Gest: 126 mg/dL (ref 65–152)
Glucose, Fasting, Gest: 71 mg/dL (ref 65–91)

## 2019-04-25 LAB — HIV ANTIBODY (ROUTINE TESTING W REFLEX): HIV 1&2 Ab, 4th Generation: NONREACTIVE

## 2019-04-25 LAB — RPR: RPR Ser Ql: NONREACTIVE

## 2019-04-25 LAB — ANTIBODY SCREEN: Antibody Screen: NOT DETECTED

## 2019-04-25 MED ORDER — BETAMETHASONE SOD PHOS & ACET 6 (3-3) MG/ML IJ SUSP
12.0000 mg | Freq: Once | INTRAMUSCULAR | Status: AC
  Administered 2019-04-25: 11:00:00 12 mg via INTRAMUSCULAR

## 2019-04-25 NOTE — Procedures (Signed)
Traci Barron [birth year] [redacted]w[redacted]d  Fetus A Non-Stress Test Interpretation for 04/25/19  Indication: IUGR  Fetal Heart Rate A Mode: External Baseline Rate (A): 140 bpm Variability: Moderate Accelerations: 15 x 15 Decelerations: None Multiple birth?: No  Uterine Activity Mode: Palpation, Toco Contraction Frequency (min): Rare Contraction Quality: Mild Resting Tone Palpated: Relaxed Resting Time: Adequate  Interpretation (Fetal Testing) Nonstress Test Interpretation: Reactive Comments: EFM tracing reviewed by Dr. Gertie Exon

## 2019-04-26 ENCOUNTER — Ambulatory Visit (HOSPITAL_COMMUNITY): Payer: PRIVATE HEALTH INSURANCE

## 2019-04-28 ENCOUNTER — Encounter (HOSPITAL_COMMUNITY): Payer: Self-pay

## 2019-04-28 ENCOUNTER — Ambulatory Visit (HOSPITAL_COMMUNITY): Payer: PRIVATE HEALTH INSURANCE | Admitting: *Deleted

## 2019-04-28 ENCOUNTER — Other Ambulatory Visit (HOSPITAL_COMMUNITY): Payer: Self-pay | Admitting: *Deleted

## 2019-04-28 ENCOUNTER — Ambulatory Visit (HOSPITAL_COMMUNITY)
Admission: RE | Admit: 2019-04-28 | Discharge: 2019-04-28 | Disposition: A | Discharge status: Home / Self Care | Payer: PRIVATE HEALTH INSURANCE | Source: Ambulatory Visit | Attending: Obstetrics and Gynecology | Admitting: Obstetrics and Gynecology

## 2019-04-28 ENCOUNTER — Other Ambulatory Visit: Payer: Self-pay

## 2019-04-28 DIAGNOSIS — O4403 Placenta previa specified as without hemorrhage, third trimester: Secondary | ICD-10-CM | POA: Insufficient documentation

## 2019-04-28 DIAGNOSIS — O99413 Diseases of the circulatory system complicating pregnancy, third trimester: Secondary | ICD-10-CM

## 2019-04-28 DIAGNOSIS — O365930 Maternal care for other known or suspected poor fetal growth, third trimester, not applicable or unspecified: Secondary | ICD-10-CM

## 2019-04-28 DIAGNOSIS — O99210 Obesity complicating pregnancy, unspecified trimester: Secondary | ICD-10-CM | POA: Insufficient documentation

## 2019-04-28 DIAGNOSIS — O289 Unspecified abnormal findings on antenatal screening of mother: Secondary | ICD-10-CM | POA: Diagnosis not present

## 2019-04-28 DIAGNOSIS — O283 Abnormal ultrasonic finding on antenatal screening of mother: Secondary | ICD-10-CM | POA: Diagnosis present

## 2019-04-28 DIAGNOSIS — O99213 Obesity complicating pregnancy, third trimester: Secondary | ICD-10-CM

## 2019-04-28 DIAGNOSIS — O09523 Supervision of elderly multigravida, third trimester: Secondary | ICD-10-CM

## 2019-04-28 DIAGNOSIS — O3413 Maternal care for benign tumor of corpus uteri, third trimester: Secondary | ICD-10-CM

## 2019-04-28 DIAGNOSIS — O365990 Maternal care for other known or suspected poor fetal growth, unspecified trimester, not applicable or unspecified: Secondary | ICD-10-CM | POA: Insufficient documentation

## 2019-04-28 DIAGNOSIS — Z3A29 29 weeks gestation of pregnancy: Secondary | ICD-10-CM

## 2019-04-28 NOTE — Procedures (Signed)
Shameika Speelman 05/18/1986 [redacted]w[redacted]d  Fetus A Non-Stress Test Interpretation for 04/28/19  Indication: IUGR  Fetal Heart Rate A Mode: External Baseline Rate (A): 145 bpm Variability: Moderate Accelerations: 15 x 15 Decelerations: Variable Multiple birth?: No  Uterine Activity Mode: Palpation, Toco Contraction Frequency (min): none Resting Tone Palpated: Relaxed Resting Time: Adequate  Interpretation (Fetal Testing) Nonstress Test Interpretation: Reactive Comments: EFM tracing reviewed by Dr. Gertie Exon

## 2019-05-01 ENCOUNTER — Other Ambulatory Visit: Payer: Self-pay

## 2019-05-01 ENCOUNTER — Ambulatory Visit (HOSPITAL_BASED_OUTPATIENT_CLINIC_OR_DEPARTMENT_OTHER): Payer: No Typology Code available for payment source

## 2019-05-01 DIAGNOSIS — Z3493 Encounter for supervision of normal pregnancy, unspecified, third trimester: Secondary | ICD-10-CM | POA: Diagnosis present

## 2019-05-01 DIAGNOSIS — O4403 Placenta previa specified as without hemorrhage, third trimester: Secondary | ICD-10-CM | POA: Diagnosis present

## 2019-05-01 DIAGNOSIS — I059 Rheumatic mitral valve disease, unspecified: Secondary | ICD-10-CM

## 2019-05-01 DIAGNOSIS — O365990 Maternal care for other known or suspected poor fetal growth, unspecified trimester, not applicable or unspecified: Secondary | ICD-10-CM | POA: Diagnosis present

## 2019-05-01 DIAGNOSIS — O365930 Maternal care for other known or suspected poor fetal growth, third trimester, not applicable or unspecified: Secondary | ICD-10-CM | POA: Diagnosis present

## 2019-05-01 DIAGNOSIS — O99210 Obesity complicating pregnancy, unspecified trimester: Secondary | ICD-10-CM | POA: Diagnosis present

## 2019-05-01 DIAGNOSIS — O283 Abnormal ultrasonic finding on antenatal screening of mother: Secondary | ICD-10-CM | POA: Diagnosis present

## 2019-05-02 ENCOUNTER — Ambulatory Visit (HOSPITAL_COMMUNITY): Payer: No Typology Code available for payment source

## 2019-05-02 ENCOUNTER — Ambulatory Visit (HOSPITAL_COMMUNITY)
Admission: RE | Admit: 2019-05-02 | Discharge: 2019-05-02 | Disposition: A | Discharge status: Home / Self Care | Payer: No Typology Code available for payment source | Source: Ambulatory Visit | Attending: Obstetrics and Gynecology | Admitting: Obstetrics and Gynecology

## 2019-05-02 ENCOUNTER — Ambulatory Visit (HOSPITAL_COMMUNITY): Payer: No Typology Code available for payment source | Admitting: *Deleted

## 2019-05-02 DIAGNOSIS — O283 Abnormal ultrasonic finding on antenatal screening of mother: Secondary | ICD-10-CM | POA: Insufficient documentation

## 2019-05-02 DIAGNOSIS — Z3493 Encounter for supervision of normal pregnancy, unspecified, third trimester: Secondary | ICD-10-CM | POA: Insufficient documentation

## 2019-05-02 DIAGNOSIS — O4403 Placenta previa specified as without hemorrhage, third trimester: Secondary | ICD-10-CM

## 2019-05-02 DIAGNOSIS — O3413 Maternal care for benign tumor of corpus uteri, third trimester: Secondary | ICD-10-CM | POA: Diagnosis not present

## 2019-05-02 DIAGNOSIS — O99210 Obesity complicating pregnancy, unspecified trimester: Secondary | ICD-10-CM | POA: Insufficient documentation

## 2019-05-02 DIAGNOSIS — O289 Unspecified abnormal findings on antenatal screening of mother: Secondary | ICD-10-CM

## 2019-05-02 DIAGNOSIS — O99213 Obesity complicating pregnancy, third trimester: Secondary | ICD-10-CM

## 2019-05-02 DIAGNOSIS — O365930 Maternal care for other known or suspected poor fetal growth, third trimester, not applicable or unspecified: Secondary | ICD-10-CM

## 2019-05-02 DIAGNOSIS — O365990 Maternal care for other known or suspected poor fetal growth, unspecified trimester, not applicable or unspecified: Secondary | ICD-10-CM | POA: Insufficient documentation

## 2019-05-02 DIAGNOSIS — O99413 Diseases of the circulatory system complicating pregnancy, third trimester: Secondary | ICD-10-CM | POA: Diagnosis not present

## 2019-05-02 DIAGNOSIS — Z3A29 29 weeks gestation of pregnancy: Secondary | ICD-10-CM

## 2019-05-02 DIAGNOSIS — I059 Rheumatic mitral valve disease, unspecified: Secondary | ICD-10-CM | POA: Insufficient documentation

## 2019-05-02 DIAGNOSIS — O09523 Supervision of elderly multigravida, third trimester: Secondary | ICD-10-CM

## 2019-05-02 NOTE — Procedures (Signed)
Traci Barron 06/05/1986 [redacted]w[redacted]d  Fetus A Non-Stress Test Interpretation for 05/02/19  Indication: IUGR  Fetal Heart Rate A Mode: External Baseline Rate (A): 140 bpm Variability: Moderate Accelerations: 15 x 15 Decelerations: None Multiple birth?: No  Uterine Activity Mode: Palpation, Toco Contraction Frequency (min): Rare Contraction Quality: Mild Resting Tone Palpated: Relaxed Resting Time: Adequate  Interpretation (Fetal Testing) Nonstress Test Interpretation: Reactive Comments: EFM tracing reviewed by Dr. Gertie Exon

## 2019-05-05 ENCOUNTER — Ambulatory Visit (HOSPITAL_COMMUNITY)
Admission: RE | Admit: 2019-05-05 | Discharge: 2019-05-05 | Disposition: A | Discharge status: Home / Self Care | Payer: PRIVATE HEALTH INSURANCE | Source: Ambulatory Visit | Attending: Obstetrics and Gynecology | Admitting: Obstetrics and Gynecology

## 2019-05-05 ENCOUNTER — Ambulatory Visit (HOSPITAL_COMMUNITY): Payer: PRIVATE HEALTH INSURANCE | Admitting: *Deleted

## 2019-05-05 ENCOUNTER — Other Ambulatory Visit: Payer: Self-pay

## 2019-05-05 ENCOUNTER — Encounter (HOSPITAL_COMMUNITY): Payer: Self-pay

## 2019-05-05 DIAGNOSIS — O3413 Maternal care for benign tumor of corpus uteri, third trimester: Secondary | ICD-10-CM

## 2019-05-05 DIAGNOSIS — O283 Abnormal ultrasonic finding on antenatal screening of mother: Secondary | ICD-10-CM | POA: Diagnosis present

## 2019-05-05 DIAGNOSIS — O289 Unspecified abnormal findings on antenatal screening of mother: Secondary | ICD-10-CM

## 2019-05-05 DIAGNOSIS — O99413 Diseases of the circulatory system complicating pregnancy, third trimester: Secondary | ICD-10-CM | POA: Diagnosis not present

## 2019-05-05 DIAGNOSIS — O99213 Obesity complicating pregnancy, third trimester: Secondary | ICD-10-CM

## 2019-05-05 DIAGNOSIS — Z3A30 30 weeks gestation of pregnancy: Secondary | ICD-10-CM

## 2019-05-05 DIAGNOSIS — O99210 Obesity complicating pregnancy, unspecified trimester: Secondary | ICD-10-CM

## 2019-05-05 DIAGNOSIS — O365930 Maternal care for other known or suspected poor fetal growth, third trimester, not applicable or unspecified: Secondary | ICD-10-CM

## 2019-05-05 DIAGNOSIS — O365990 Maternal care for other known or suspected poor fetal growth, unspecified trimester, not applicable or unspecified: Secondary | ICD-10-CM

## 2019-05-05 DIAGNOSIS — O4403 Placenta previa specified as without hemorrhage, third trimester: Secondary | ICD-10-CM

## 2019-05-05 DIAGNOSIS — O09523 Supervision of elderly multigravida, third trimester: Secondary | ICD-10-CM

## 2019-05-05 NOTE — Procedures (Signed)
Traci Barron October 23, 1985 [redacted]w[redacted]d  Fetus A Non-Stress Test Interpretation for 05/05/19  Indication: IUGR  Fetal Heart Rate A Mode: External Baseline Rate (A): 145 bpm Variability: Moderate Accelerations: 10 x 10 Decelerations: None Multiple birth?: No  Uterine Activity Mode: Palpation, Toco Contraction Frequency (min): none Resting Tone Palpated: Relaxed Resting Time: Adequate  Interpretation (Fetal Testing) Nonstress Test Interpretation: Reactive Overall Impression: Reassuring for gestational age Comments: EFM tracing reviewed by Dr. Donalee Citrin

## 2019-05-09 ENCOUNTER — Encounter (HOSPITAL_COMMUNITY): Payer: Self-pay

## 2019-05-09 ENCOUNTER — Ambulatory Visit (HOSPITAL_COMMUNITY)
Admission: RE | Admit: 2019-05-09 | Discharge: 2019-05-09 | Disposition: A | Discharge status: Home / Self Care | Payer: PRIVATE HEALTH INSURANCE | Source: Ambulatory Visit | Attending: Obstetrics and Gynecology | Admitting: Obstetrics and Gynecology

## 2019-05-09 ENCOUNTER — Ambulatory Visit (HOSPITAL_COMMUNITY): Payer: PRIVATE HEALTH INSURANCE | Admitting: *Deleted

## 2019-05-09 ENCOUNTER — Other Ambulatory Visit: Payer: Self-pay

## 2019-05-09 ENCOUNTER — Telehealth (INDEPENDENT_AMBULATORY_CARE_PROVIDER_SITE_OTHER): Payer: PRIVATE HEALTH INSURANCE | Admitting: Advanced Practice Midwife

## 2019-05-09 ENCOUNTER — Telehealth: Payer: Self-pay

## 2019-05-09 VITALS — BP 125/83

## 2019-05-09 DIAGNOSIS — Z362 Encounter for other antenatal screening follow-up: Secondary | ICD-10-CM | POA: Diagnosis not present

## 2019-05-09 DIAGNOSIS — O4403 Placenta previa specified as without hemorrhage, third trimester: Secondary | ICD-10-CM | POA: Diagnosis present

## 2019-05-09 DIAGNOSIS — O283 Abnormal ultrasonic finding on antenatal screening of mother: Secondary | ICD-10-CM

## 2019-05-09 DIAGNOSIS — Z3A30 30 weeks gestation of pregnancy: Secondary | ICD-10-CM

## 2019-05-09 DIAGNOSIS — O365930 Maternal care for other known or suspected poor fetal growth, third trimester, not applicable or unspecified: Secondary | ICD-10-CM

## 2019-05-09 DIAGNOSIS — O99210 Obesity complicating pregnancy, unspecified trimester: Secondary | ICD-10-CM | POA: Diagnosis present

## 2019-05-09 DIAGNOSIS — O09523 Supervision of elderly multigravida, third trimester: Secondary | ICD-10-CM

## 2019-05-09 DIAGNOSIS — Z3480 Encounter for supervision of other normal pregnancy, unspecified trimester: Secondary | ICD-10-CM

## 2019-05-09 DIAGNOSIS — O99413 Diseases of the circulatory system complicating pregnancy, third trimester: Secondary | ICD-10-CM

## 2019-05-09 DIAGNOSIS — O3413 Maternal care for benign tumor of corpus uteri, third trimester: Secondary | ICD-10-CM | POA: Diagnosis not present

## 2019-05-09 DIAGNOSIS — O289 Unspecified abnormal findings on antenatal screening of mother: Secondary | ICD-10-CM

## 2019-05-09 NOTE — Telephone Encounter (Signed)
Called pt attempting to begin virtual visit. Pt did not answer. Office number provided for pt to return call.

## 2019-05-09 NOTE — Procedures (Signed)
Traci Barron 06/11/1986 [redacted]w[redacted]d  Fetus A Non-Stress Test Interpretation for 05/09/19  Indication: IUGR  Fetal Heart Rate A Mode: External Baseline Rate (A): 150 bpm Variability: Moderate Accelerations: 10 x 10 Decelerations: None Multiple birth?: No  Uterine Activity Mode: Palpation, Toco Contraction Frequency (min): None Resting Tone Palpated: Relaxed Resting Time: Adequate  Interpretation (Fetal Testing) Nonstress Test Interpretation: Reactive Overall Impression: Reassuring for gestational age Comments: EFM tracing reviewed by Dr. Annamaria Boots

## 2019-05-09 NOTE — Progress Notes (Signed)
TELEHEALTH OBSTETRICS PRENATAL VIRTUAL VIDEO VISIT ENCOUNTER NOTE  Provider location: [facility]   I connected with Traci Barron on 05/09/19 at 10:55 AM EDT by MyChart Video Encounter at home and verified that I am speaking with the correct person using two identifiers.   I discussed the limitations, risks, security and privacy concerns of performing an evaluation and management service virtually and the availability of in person appointments. I also discussed with the patient that there may be a patient responsible charge related to this service. The patient expressed understanding and agreed to proceed. Subjective:  Traci Barron is a 33 y.o. G1P0 at [redacted]w[redacted]d being seen today for ongoing prenatal care.  She is currently monitored for the following issues for this high-risk pregnancy and has Mitral valve disease; Supervision of normal pregnancy; Obesity in pregnancy; AMA (advanced maternal age) multigravida 35+; Placenta previa; Abnormal fetal ultrasound; and Pregnancy affected by fetal growth restriction on their problem list.  Patient reports no complaints.  Contractions: Not present. Vag. Bleeding: None.  Movement: Present. Denies any leaking of fluid.   The following portions of the patient's history were reviewed and updated as appropriate: allergies, current medications, past family history, past medical history, past social history, past surgical history and problem list.   Objective:   Vitals:   05/09/19 1132  BP: 125/83    Fetal Status:     Movement: Present     General:  Alert, oriented and cooperative. Patient is in no acute distress.  Respiratory: Normal respiratory effort, no problems with respiration noted  Mental Status: Normal mood and affect. Normal behavior. Normal judgment and thought content.  Rest of physical exam deferred due to type of encounter  Imaging: [place] Mfm Ob Follow Up  Result Date:  05/09/2019 ----------------------------------------------------------------------  OBSTETRICS REPORT                       (Signed Final 05/09/2019 08:54 am) ---------------------------------------------------------------------- Patient Info  ID #:       [id number]                          D.O.B.:  April 18, 1986 (33 yrs)  Name:       Traci Barron                   Visit Date: 05/09/2019 08:20 am ---------------------------------------------------------------------- Performed By  Performed By:     Marcellina Millin          Ref. Address:     [address]                                                             [place], [place]  [address]  Attending:        Ma Rings MD         [facility]  Referred By:      Allie Bossier MD ---------------------------------------------------------------------- Orders   #  Description                          Code         Ordered By   1  [place] MFM UA CORD DOPPLER               76820.02     Lin Landsman   2  [place] MFM OB FOLLOW UP                  76816.01     Lin Landsman  ----------------------------------------------------------------------   #  Order #                    Accession #                 Episode #   1  [id number]                  [id number]                  [id number]   2  [id number]                  [id number]                  [id number]  ---------------------------------------------------------------------- Indications   Small for gestational age fetus affecting      O36.5990   management of mother   Advanced maternal age multigravida 4+,        O13.523   third trimester   Uterine fibroids                               O34.10    Placenta previa specified as without           O44.03   hemorrhage, third trimester   Abnormal finding on antenatal screening        O28.9   (shortened long bones)   Heart disease in mother affecting pregnancy    O99.413   in third trimester   [redacted] weeks gestation of pregnancy  Z3A.30  ---------------------------------------------------------------------- Vital Signs                                                 Height:        5'6" ---------------------------------------------------------------------- Fetal Evaluation  Num Of Fetuses:         1  Fetal Heart Rate(bpm):  167  Cardiac Activity:       Observed  Presentation:           Cephalic  Placenta:               Posterior Previa  P. Cord Insertion:      Previously Visualized  Amniotic Fluid  AFI FV:      Within normal limits  AFI Sum(cm)     %Tile       Largest Pocket(cm)  11.33           25          3.77  RUQ(cm)       RLQ(cm)       LUQ(cm)        LLQ(cm)  2.1           2.62          3.77           2.84 ---------------------------------------------------------------------- Biometry  BPD:      71.5  mm     G. Age:  28w 5d          2  %    CI:        69.92   %    70 - 86                                                          FL/HC:      19.2   %    19.3 - 21.3  HC:      272.8  mm     G. Age:  29w 5d          3  %    HC/AC:      1.13        0.96 - 1.17  AC:      241.1  mm     G. Age:  28w 3d          2  %    FL/BPD:     73.4   %    71 - 87  FL:       52.5  mm     G. Age:  28w 0d        < 1  %    FL/AC:      21.8   %    20 - 24  HUM:        46  mm     G. Age:  27w 1d        < 5  %  Est. FW:    1220  gm    2 lb 11 oz       1  % ---------------------------------------------------------------------- OB History  Gravidity:    1         Term:  0        Prem:   0        SAB:   0  TOP:          0       Ectopic:  0        Living: 0 ---------------------------------------------------------------------- Gestational Age  LMP:           32w 0d        Date:   09/27/18                 EDD:   07/04/19  U/S Today:     28w 5d                                        EDD:   07/27/19  Best:          30w 6d     Det. ByMarcella Dubs         EDD:   07/12/19                                      (12/08/18) ---------------------------------------------------------------------- Anatomy  Cranium:               Appears normal         Aortic Arch:            Previously seen  Cavum:                 Appears normal         Ductal Arch:            Previously seen  Ventricles:            Appears normal         Diaphragm:              Appears normal  Choroid Plexus:        Appears normal         Stomach:                Appears normal, left                                                                        sided  Cerebellum:            Appears normal         Abdomen:                Appears normal  Posterior Fossa:       Appears normal         Abdominal Wall:         Previously seen  Nuchal Fold:           Previously seen        Cord Vessels:           Previously seen  Face:                  Orbits and profile     Kidneys:  Appear normal                         previously seen  Lips:                  Appears normal         Bladder:                Appears normal  Thoracic:              Appears normal         Spine:                  Previously seen  Heart:                 Previously seen        Upper Extremities:      Previously seen  RVOT:                  Previously seen        Lower Extremities:      Previously seen  LVOT:                  Previously seen  Other:  Female gender ---------------------------------------------------------------------- Doppler - Fetal Vessels  Umbilical Artery   S/D     %tile                                            ADFV    RDFV  4.71    > 97.5                                               No      No ---------------------------------------------------------------------- Cervix Uterus Adnexa  Cervix  Normal appearance by transabdominal scan.  ---------------------------------------------------------------------- Comments  This patient has been followed due to an IUGR fetus.  She is  being followed with twice weekly fetal testing and Doppler  studies.  She reports feeling vigorous fetal movements  throughout the day and reports that her blood pressures have  been within normal limits.  Due to an IUGR fetus, the patient  has already received a complete course of antenatal  corticosteroids.  On today's exam, the overall EFW continues to measure at  less than the 10th percentile for her gestational age,  indicating IUGR.  The fetus has shown an 11 ounce growth  over the past 3 weeks.  There was normal amniotic fluid  noted.  Doppler studies of the umbilical arteries performed due to  IUGR continues to show an elevated S/D ratio of 4.7.  As the fetus has continued to show growth and the umbilical  artery Doppler studies remain stable, the patient was advised  that we will continue expectant management for now with  close fetal surveillance.  The patient will continue twice  weekly fetal testing with twice weekly umbilical artery Doppler  studies.  The patient understands that delivery will be  indicated should there be a worsening of her umbilical artery  Doppler studies or at any time for nonreassuring fetal status. ----------------------------------------------------------------------                   Ma Rings, MD  Electronically Signed Final Report   05/09/2019 08:54 am ----------------------------------------------------------------------  [place] Mfm Ob Follow Up  Result Date: 04/18/2019 ----------------------------------------------------------------------  OBSTETRICS REPORT                       (Signed Final 04/18/2019 10:51 am) ---------------------------------------------------------------------- Patient Info  ID #:       [id number]                          D.O.B.:  04-Jan-1986 (33 yrs)  Name:       Traci Barron                   Visit Date:  04/18/2019 07:52 am ---------------------------------------------------------------------- Performed By  Performed By:     Hurman Horn          Ref. Address:     [address]                                                             [place], [place]                                                             [address]  Attending:        Noralee Space MD        [facility]  Referred By:      Allie Bossier MD ---------------------------------------------------------------------- Orders   #  Description                          Code         Ordered By   1  [place] MFM OB FOLLOW UP                  21308.65     RAVI SHANKAR   2  [place] MFM UA CORD DOPPLER               78469.62     RAVI SHANKAR  ----------------------------------------------------------------------   #  Order #  Accession #                 Episode #   1  [id number]                  [id number]   2  [id number]                  [id number]                  [id number]  ---------------------------------------------------------------------- Indications   Encounter for other antenatal screening        Z36.2   follow-up   Obesity complicating pregnancy, second         O99.212   trimester   Advanced maternal age multigravida 27+,        O84.522   second trimester   Heart disease in mother affecting pregnancy    O99.412   in second trimester (mitral valve prolapse)   Uterine fibroids                               O34.10   Abnormal finding on antenatal screening        O28.9   (shortened long bones)   Placenta previa specified as without           O44.02   hemorrhage, second trimester   Small for gestational age fetus affecting      O27.5990   management of mother   [redacted] weeks gestation of pregnancy                Z3A.27   ---------------------------------------------------------------------- Vital Signs                                                 Height:        5'6" ---------------------------------------------------------------------- Fetal Evaluation  Num Of Fetuses:         1  Fetal Heart Rate(bpm):  132  Cardiac Activity:       Observed  Presentation:           Cephalic  Placenta:               Posterior Previa  Amniotic Fluid  AFI FV:      Within normal limits                              Largest Pocket(cm)                              5.2 ---------------------------------------------------------------------- Biometry  BPD:      67.2  mm     G. Age:  27w 0d         16  %    CI:        69.69   %    70 - 86  FL/HC:      16.8   %    18.8 - 20.6  HC:      256.9  mm     G. Age:  27w 6d         22  %    HC/AC:      1.13        1.05 - 1.21  AC:       227   mm     G. Age:  27w 1d         20  %    FL/BPD:     64.3   %    71 - 87  FL:       43.2  mm     G. Age:  24w 1d        < 1  %    FL/AC:      19.0   %    20 - 24  HUM:      41.6  mm     G. Age:  25w 1d        < 5  %  LV:        8.6  mm  Est. FW:     897  gm           2 lb      3  % ---------------------------------------------------------------------- OB History  Gravidity:    1         Term:   0        Prem:   0        SAB:   0  TOP:          0       Ectopic:  0        Living: 0 ---------------------------------------------------------------------- Gestational Age  LMP:           29w 0d        Date:  09/27/18                 EDD:   07/04/19  U/S Today:     26w 4d                                        EDD:   07/21/19  Best:          27w 6d     Det. By:  Marcella Dubs         EDD:   07/12/19                                      (12/08/18) ---------------------------------------------------------------------- Anatomy  Cranium:               Appears normal         Aortic Arch:            Previously seen  Cavum:                  Appears normal         Ductal Arch:            Previously seen  Ventricles:            Appears normal         Diaphragm:  Appears normal  Choroid Plexus:        Previously seen        Stomach:                Appears normal, left                                                                        sided  Cerebellum:            Previously seen        Abdomen:                Appears normal  Posterior Fossa:       Previously seen        Abdominal Wall:         Previously seen  Nuchal Fold:           Previously seen        Cord Vessels:           Previously seen  Face:                  Orbits and profile     Kidneys:                Appear normal                         previously seen  Lips:                  Appears normal         Bladder:                Appears normal  Thoracic:              Appears normal         Spine:                  Previously seen  Heart:                 Appears normal         Upper Extremities:      Previously seen                         (4CH, axis, and                         situs)  RVOT:                  Appears normal         Lower Extremities:      Previously seen  LVOT:                  Appears normal ---------------------------------------------------------------------- Doppler - Fetal Vessels  Umbilical Artery                                                            ADFV    RDFV  Yes      No ---------------------------------------------------------------------- Cervix Uterus Adnexa  Cervix  Length:           3.97  cm.  Normal appearance by transabdominal scan.  Uterus  Multiple fibroids noted, see table below.  Left Ovary  Not visualized.  Right Ovary  Not visualized.  Adnexa  No abnormality visualized. ---------------------------------------------------------------------- Impression  Patient returned for fetal growth assessment. On previous  ultrasound, femur and humerus measured at less than the  5th  percentiles. She also has placenta previa with no history  of vaginal bleeding.  She does not have hypertension and blood pressure at our  office is 128/83 mm Hg. Patient will be undergoing screening  for gestational diabetes at your office next week.  On ultrasound, the estimated fetal weight is at the 3rd  percentile. Amniotic fluid is normal and good fetal activity is  seen. Femur and humerus measure at less than the 5th  percentiles. Umbilical artery (UA) Doppler was performed  because of the finding of fetal growth restriction. Intermittent  absent-end diastolic flow is seen.  On transabdominal scan, placenta previa covering the  internal os is seen.  NST is reactive.  I explained fetal growth restriction and the significance of  antenatal testing including Doppler studies. I explained more-  frequent fetal surveillance (twice-weekly from next week).  I counseled her on the benefit of antenatal corticosteroids  (betamethasone) for fetal lung maturity.  Timing of delivery is dependent on progression of UA Doppler  and other modes of antenatal testing. Early delivery (34  weeks if absent and 32 weeks if reversed) would be  recommended.  We made appointments for follow-up ultrasound.  Patient has an appointment with her Ob next week (08/25). I  advised her to undergo screening for gestational diabetes  BEFORE steroid treatment. If she gets the first dose on  08/25, she will be given the second dose on 8/26. ---------------------------------------------------------------------- Recommendations  -Follow-up UA Doppler and NST next week.  -UA Doppler on Tue-Fri.  -NST on Tue.  -Fetal growth in 3 weeks.  -Betamethasone next week.  -Screening for GDM before initiating steroids.  -Husband to accompany Ms. Coye [facility]. ----------------------------------------------------------------------                  Noralee Spaceavi Shankar, MD Electronically Signed Final Report   04/18/2019 10:51 am  ----------------------------------------------------------------------  [place] Mfm Ua Cord Doppler  Result Date: 05/09/2019 ----------------------------------------------------------------------  OBSTETRICS REPORT                       (Signed Final 05/09/2019 08:54 am) ---------------------------------------------------------------------- Patient Info  ID #:       [id number]                          D.O.B.:  03-Jan-1986 (33 yrs)  Name:       Traci LollLYNDSEY Ferber                   Visit Date: 05/09/2019 08:20 am ---------------------------------------------------------------------- Performed By  Performed By:     Marcellina MillinKelly L Moser          Ref. Address:     [address]                    [facility]  [address]                                                             [place], [place]                                                             [id number]  Attending:        Ma RingsVictor Fang MD         [facility]  Referred By:      Allie BossierMYRA C DOVE MD ---------------------------------------------------------------------- Orders   #  Description                          Code         Ordered By   1  US MFM UA CORD DOPPLER               76820.02     Lin LandsmanORENTHIAN                                                        BOOKER   2  US MFM OB FOLLOW UP                  76816.01     Lin LandsmanCORENTHIAN                                                        BOOKER  ----------------------------------------------------------------------   #  Order #                    Accession #                 Episode #   1  [id number]                  [phone]                  [id number]   2  [id number]                  [id number]                  [id number]  ---------------------------------------------------------------------- Indications   Small for gestational age fetus affecting      O36.5990   management of mother   Advanced  maternal  age multigravida 17+,        O87.523   third trimester   Uterine fibroids                               O34.10   Placenta previa specified as without           O44.03   hemorrhage, third trimester   Abnormal finding on antenatal screening        O28.9   (shortened long bones)   Heart disease in mother affecting pregnancy    O99.413   in third trimester   [redacted] weeks gestation of pregnancy                Z3A.30  ---------------------------------------------------------------------- Vital Signs                                                 Height:        5'6" ---------------------------------------------------------------------- Fetal Evaluation  Num Of Fetuses:         1  Fetal Heart Rate(bpm):  167  Cardiac Activity:       Observed  Presentation:           Cephalic  Placenta:               Posterior Previa  P. Cord Insertion:      Previously Visualized  Amniotic Fluid  AFI FV:      Within normal limits  AFI Sum(cm)     %Tile       Largest Pocket(cm)  11.33           25          3.77  RUQ(cm)       RLQ(cm)       LUQ(cm)        LLQ(cm)  2.1           2.62          3.77           2.84 ---------------------------------------------------------------------- Biometry  BPD:      71.5  mm     G. Age:  28w 5d          2  %    CI:        69.92   %    70 - 86                                                          FL/HC:      19.2   %    19.3 - 21.3  HC:      272.8  mm     G. Age:  29w 5d          3  %    HC/AC:      1.13        0.96 - 1.17  AC:      241.1  mm     G. Age:  28w 3d          2  %    FL/BPD:     73.4   %  71 - 87  FL:       52.5  mm     G. Age:  28w 0d        < 1  %    FL/AC:      21.8   %    20 - 24  HUM:        46  mm     G. Age:  27w 1d        < 5  %  Est. FW:    1220  gm    2 lb 11 oz       1  % ---------------------------------------------------------------------- OB History  Gravidity:    1         Term:   0        Prem:   0        SAB:   0  TOP:          0       Ectopic:  0        Living: 0  ---------------------------------------------------------------------- Gestational Age  LMP:           32w 0d        Date:  09/27/18                 EDD:   07/04/19  U/S Today:     28w 5d                                        EDD:   07/27/19  Best:          30w 6d     Det. ByMarcella Dubs         EDD:   07/12/19                                      (12/08/18) ---------------------------------------------------------------------- Anatomy  Cranium:               Appears normal         Aortic Arch:            Previously seen  Cavum:                 Appears normal         Ductal Arch:            Previously seen  Ventricles:            Appears normal         Diaphragm:              Appears normal  Choroid Plexus:        Appears normal         Stomach:                Appears normal, left                                                                        sided  Cerebellum:  Appears normal         Abdomen:                Appears normal  Posterior Fossa:       Appears normal         Abdominal Wall:         Previously seen  Nuchal Fold:           Previously seen        Cord Vessels:           Previously seen  Face:                  Orbits and profile     Kidneys:                Appear normal                         previously seen  Lips:                  Appears normal         Bladder:                Appears normal  Thoracic:              Appears normal         Spine:                  Previously seen  Heart:                 Previously seen        Upper Extremities:      Previously seen  RVOT:                  Previously seen        Lower Extremities:      Previously seen  LVOT:                  Previously seen  Other:  Female gender ---------------------------------------------------------------------- Doppler - Fetal Vessels  Umbilical Artery   S/D     %tile                                            ADFV    RDFV  4.71    > 97.5                                               No      No  ---------------------------------------------------------------------- Cervix Uterus Adnexa  Cervix  Normal appearance by transabdominal scan. ---------------------------------------------------------------------- Comments  This patient has been followed due to an IUGR fetus.  She is  being followed with twice weekly fetal testing and Doppler  studies.  She reports feeling vigorous fetal movements  throughout the day and reports that her blood pressures have  been within normal limits.  Due to an IUGR fetus, the patient  has already received a complete course of antenatal  corticosteroids.  On today's exam, the overall EFW continues to measure at  less than the 10th percentile for her gestational age,  indicating IUGR.  The fetus has shown an 11 ounce growth  over the past  3 weeks.  There was normal amniotic fluid  noted.  Doppler studies of the umbilical arteries performed due to  IUGR continues to show an elevated S/D ratio of 4.7.  As the fetus has continued to show growth and the umbilical  artery Doppler studies remain stable, the patient was advised  that we will continue expectant management for now with  close fetal surveillance.  The patient will continue twice  weekly fetal testing with twice weekly umbilical artery Doppler  studies.  The patient understands that delivery will be  indicated should there be a worsening of her umbilical artery  Doppler studies or at any time for nonreassuring fetal status. ----------------------------------------------------------------------                   Ma Rings, MD Electronically Signed Final Report   05/09/2019 08:54 am ----------------------------------------------------------------------  [place] Mfm Ua Cord Doppler  Result Date: 05/05/2019 ----------------------------------------------------------------------  OBSTETRICS REPORT                       (Signed Final 05/05/2019 10:04 am) ---------------------------------------------------------------------- Patient  Info  ID #:       [id number]                          D.O.B.:  09-23-1985 (33 yrs)  Name:       Traci Barron                   Visit Date: 05/05/2019 09:01 am ---------------------------------------------------------------------- Performed By  Performed By:     Ellin Saba        Ref. Address:     [address]                                                             [place], [place]                                                             [address]  Attending:        Noralee Space MD        [facility]  Referred By:      Leanora Ivanoff  DOVE MD ---------------------------------------------------------------------- Orders   #  Description                          Code         Ordered By   1  [place] MFM UA CORD DOPPLER               [address].60     RAVI [facility]  ----------------------------------------------------------------------   #  Order #                    Accession #                 Episode #   1  [id number]                  [id number]                  [id number]  ---------------------------------------------------------------------- Indications   Obesity complicating pregnancy, third          O99.213   trimester   [redacted] weeks gestation of pregnancy                Z3A.30   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    O99.412   in second trimester (mitral valve prolapse)   Uterine fibroids                               O34.10   Abnormal finding on antenatal screening        O28.9   (shortened long bones)   Placenta previa specified as without           O44.02   hemorrhage, second trimester   Advanced maternal age multigravida 63+,        O9.523   third trimester   Small for gestational age fetus affecting      O37.5990   management of mother  ---------------------------------------------------------------------- Vital Signs                                                  Height:        5'6" ---------------------------------------------------------------------- Fetal Evaluation  Num Of Fetuses:         1  Fetal Heart Rate(bpm):  150  Cardiac Activity:       Observed  Presentation:           Cephalic  Amniotic Fluid  AFI FV:      Within normal limits                              Largest Pocket(cm)                              4.64 ---------------------------------------------------------------------- OB History  Gravidity:    1         Term:   0        Prem:   0        SAB:   0  TOP:          0       Ectopic:  0        Living: 0 ----------------------------------------------------------------------  Gestational Age  LMP:           31w 3d        Date:  09/27/18                 EDD:   07/04/19  Best:          Georgiann Hahn 2d     Det. ByMarcella Dubs         EDD:   07/12/19                                      (12/08/18) ---------------------------------------------------------------------- Anatomy  Stomach:               Appears normal, left   Bladder:                Appears normal                         sided ---------------------------------------------------------------------- Doppler - Fetal Vessels  Umbilical Artery   S/D     %tile     RI                                     ADFV  8.08    > 97.5  0.88                                        Yes ---------------------------------------------------------------------- Impression  Severe fetal growth restriction with abnormal umbilical artery  (UA) Doppler studies. Patient had received a course of  antenatal corticosteroids.  Amniotic fluid is normal and good fetal activity is seen.  Umbilical artery Doppler showed intermittent absent-end-  diastolic flow. NST is reactive for this gestational age  (accelerations 10 x 10).  I reassured the patient of the findings. We briefly discussed  the timing of delivery that will be based on Doppler studies,  antenatal testing and interval fetal growth.  ---------------------------------------------------------------------- Recommendations  -Fetal growth assessment next visit.  -Continue twice-weekly UA Doppler studies. ----------------------------------------------------------------------                  Noralee Space, MD Electronically Signed Final Report   05/05/2019 10:04 am ----------------------------------------------------------------------  [place] Mfm Ua Cord Doppler  Result Date: 05/02/2019 ----------------------------------------------------------------------  OBSTETRICS REPORT                       (Signed Final 05/02/2019 03:16 pm) ---------------------------------------------------------------------- Patient Info  ID #:       [id number]                          D.O.B.:  December 19, 1985 (33 yrs)  Name:       Traci Barron                   Visit Date: 05/02/2019 09:14 am ---------------------------------------------------------------------- Performed By  Performed By:     Lenise Arena        Ref. Address:     [address]                    [facility]  [place], [place]  Attending:        Sander Nephew      [facility]  Referred By:      Emily Filbert MD ---------------------------------------------------------------------- Orders   #  Description                          Code         Ordered By   1  [place] MFM UA CORD DOPPLER               76820.02     [facility]  ----------------------------------------------------------------------   #  Order #                    Accession #                 Episode #   1  [id number]                  [id number]                  [id number]  ---------------------------------------------------------------------- Indications   Obesity complicating  pregnancy, third          O99.213   trimester   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    O99.412   in second trimester (mitral valve prolapse)   Uterine fibroids                               O34.10   Abnormal finding on antenatal screening        O28.9   (shortened long bones)   Placenta previa specified as without           O44.02   hemorrhage, second trimester   Advanced maternal age multigravida 20+,        O62.523   third trimester   Small for gestational age fetus affecting      O65.5990   management of mother   [redacted] weeks gestation of pregnancy                Z3A.29  ---------------------------------------------------------------------- Vital Signs  Weight (lb): 244  Height:        5'6"  BMI:         39.38 ---------------------------------------------------------------------- Fetal Evaluation  Num Of Fetuses:         1  Fetal Heart Rate(bpm):  162  Cardiac Activity:       Observed  Presentation:           Breech  Amniotic Fluid  AFI FV:      Within normal limits  AFI Sum(cm)     %Tile       Largest Pocket(cm)  13.22           39          3.6  RUQ(cm)       RLQ(cm)       LUQ(cm)        LLQ(cm)  3.6           2.95          3.2            3.47 ---------------------------------------------------------------------- OB History  Gravidity:    1         Term:   0        Prem:   0        SAB:   0  TOP:          0       Ectopic:  0        Living: 0 ---------------------------------------------------------------------- Gestational Age  LMP:           31w 0d        Date:  09/27/18                 EDD:   07/04/19  Best:          29w 6d     Det. ByMarcella Dubs         EDD:   07/12/19                                      (12/08/18) ---------------------------------------------------------------------- Anatomy  Thoracic:              Appears normal ---------------------------------------------------------------------- Doppler - Fetal Vessels  Umbilical Artery   S/D     %tile  4.25        96 ---------------------------------------------------------------------- Cervix Uterus Adnexa  Cervix  Not visualized (advanced GA >24wks) ---------------------------------------------------------------------- Impression  Elevated UA Dopplers ---------------------------------------------------------------------- Recommendations  Continue 2x weekly testing and UA Dopplers. ----------------------------------------------------------------------               Lin Landsman, MD Electronically Signed Final Report   05/02/2019 03:16 pm ----------------------------------------------------------------------  [place] Mfm Ua Cord Doppler  Result Date: 04/28/2019 ----------------------------------------------------------------------  OBSTETRICS REPORT                       (Signed Final 04/28/2019 08:59 am) ---------------------------------------------------------------------- Patient Info  ID #:       [id number]                          D.O.B.:  Jan 10, 1986 (33 yrs)  Name:       Traci Barron                   Visit Date: 04/28/2019 08:47 am ---------------------------------------------------------------------- Performed By  Performed By:     Emeline Darling  BS,      Ref. Address:     [address]                                                             [place], [place]                                                             [address]  Attending:        Lin Landsman      [facility]  Referred By:      Allie Bossier MD ---------------------------------------------------------------------- Orders   #  Description                          Code         Ordered By   1  [place] MFM UA CORD DOPPLER               [phone]     [facility]  ----------------------------------------------------------------------   #  Order #                    Accession  #                 Episode #   1  [id number]                  [id number]                  [id number]  ---------------------------------------------------------------------- Indications   [redacted] weeks gestation of pregnancy                Z3A.29   Obesity complicating pregnancy, third          O99.213   trimester   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    O99.412   in second trimester (mitral valve prolapse)   Uterine fibroids                               O34.10   Abnormal finding on antenatal screening        O28.9   (shortened long  bones)   Placenta previa specified as without           O44.02   hemorrhage, second trimester   Advanced maternal age multigravida 27+,        O38.523   third trimester   Small for gestational age fetus affecting      O71.5990   management of mother  ---------------------------------------------------------------------- Vital Signs  Weight (lb): 244                               Height:        5'6"  BMI:         39.38 ---------------------------------------------------------------------- Fetal Evaluation  Num Of Fetuses:         1  Fetal Heart Rate(bpm):  140  Cardiac Activity:       Observed  Presentation:           Cephalic  Placenta:               Posterior, low-lying, 1.8 cm from int os  P. Cord Insertion:      Previously Visualized  Amniotic Fluid  AFI FV:      Within normal limits ---------------------------------------------------------------------- OB History  Gravidity:    1         Term:   0        Prem:   0        SAB:   0  TOP:          0       Ectopic:  0        Living: 0 ---------------------------------------------------------------------- Gestational Age  LMP:           30w 3d        Date:  09/27/18                 EDD:   07/04/19  Best:          29w 2d     Det. ByMarcella Dubs         EDD:   07/12/19                                      (12/08/18) ---------------------------------------------------------------------- Doppler - Fetal Vessels  Umbilical Artery   S/D      %tile     RI              PI                     ADFV    RDFV  5.85    > 97.5  0.83             1.59                        No      No ---------------------------------------------------------------------- Impression  Known IUGR  Elevated UA Dopplers ---------------------------------------------------------------------- Recommendations  Continue 2x weekly UA Dopplers  Repeat growth in 2 weeks. ----------------------------------------------------------------------               Lin Landsman, MD Electronically Signed Final Report   04/28/2019 08:59 am ----------------------------------------------------------------------  [place] Mfm Ua Cord Doppler  Result Date: 04/25/2019 ----------------------------------------------------------------------  OBSTETRICS REPORT                       (  Signed Final 04/25/2019 12:59 pm) ---------------------------------------------------------------------- Patient Info  ID #:       [id number]                          D.O.B.:  [birth year] (33 yrs)  Name:       Traci Barron                   Visit Date: 04/25/2019 08:06 am ---------------------------------------------------------------------- Performed By  Performed By:     Birdena Crandall        Ref. Address:     [address]                                                             [place], [place]                                                             [address]  Attending:        Lin Landsman      [facility]  Referred By:      Allie Bossier MD ---------------------------------------------------------------------- Orders   #  Description                          Code         Ordered By   1  [place] MFM UA CORD DOPPLER               19[address].82     Noralee Space  ----------------------------------------------------------------------   #  Order #                     Accession #                 Episode #   1  [id number]                  [id number]                  [id number]  ---------------------------------------------------------------------- Indications   Small for gestational age fetus affecting      O36.5990   management of mother   Obesity  complicating pregnancy, second         O99.212   trimester   Advanced maternal age multigravida 3+,        O36.522   second trimester   Heart disease in mother affecting pregnancy    O99.412   in second trimester (mitral valve prolapse)   Uterine fibroids                               O34.10   Abnormal finding on antenatal screening        O28.9   (shortened long bones)   Placenta previa specified as without           O44.02   hemorrhage, second trimester   [redacted] weeks gestation of pregnancy                Z3A.28  ---------------------------------------------------------------------- Vital Signs                                                 Height:        5'6" ---------------------------------------------------------------------- Fetal Evaluation  Num Of Fetuses:         1  Fetal Heart Rate(bpm):  136  Cardiac Activity:       Observed  Presentation:           Cephalic  Placenta:               Posterior, low-lying, 1.8cm from int os  P. Cord Insertion:      Visualized, central  Amniotic Fluid  AFI FV:      Within normal limits  AFI Sum(cm)     %Tile       Largest Pocket(cm)  15.28           54          4.3  RUQ(cm)       RLQ(cm)       LUQ(cm)        LLQ(cm)  4.3           3.67          4.3            3.01  Comment:    Breathing visualized . ---------------------------------------------------------------------- Biometry  LV:        8.7  mm ---------------------------------------------------------------------- OB History  Gravidity:    1         Term:   0        Prem:   0        SAB:   0  TOP:          0       Ectopic:  0        Living: 0 ---------------------------------------------------------------------- Gestational Age   LMP:           30w 0d        Date:  09/27/18                 EDD:   07/04/19  Best:          Eden Emms 6d     Det. ByMarcella Dubs         EDD:   07/12/19                                      (  12/08/18) ---------------------------------------------------------------------- Anatomy  Ventricles:            Appears normal         Abdominal Wall:         Appears nml (cord                                                                        insert, abd wall)  Heart:                 Appears normal         Cord Vessels:           Appears normal (3                         (4CH, axis, and                                vessel cord)                         situs)  RVOT:                  Appears normal         Kidneys:                Appear normal  Stomach:               Appears normal, left   Bladder:                Appears normal                         sided ---------------------------------------------------------------------- Doppler - Fetal Vessels  Umbilical Artery   S/D     %tile     RI              PI  5.72    > 97.5  0.83             1.54 ---------------------------------------------------------------------- Cervix Uterus Adnexa  Cervix  Length:              4  cm.  Normal appearance by transabdominal scan.  Uterus  Multiple fibroids noted, see table below.  Cul De Sac  No free fluid seen. ---------------------------------------------------------------------- Myomas   Site                     L(cm)      W(cm)      D(cm)      Location   Fundus post              7.8        5.6        5   Left                     4.2        3.75       2.75   Ant                      2.11  2.46       1.37  ----------------------------------------------------------------------   Blood Flow                 RI        PI       Comments  ---------------------------------------------------------------------- Impression  Limited exam follow up IUGR  EFW 3% with previously  absent EDF.  Low lying placenta 1.8 cm from internal os  Recent  administration of BMZ and normal 2hr GTT.  Elevated UA Dopplers today on exam.  NST- reactive. ---------------------------------------------------------------------- Recommendations  Continue 2x weekly UA Doppler given abnormal Dopplers  Repeat growth in 2 weeks.  Delivery by 37 weeks given EFW 3%, if Dopplers become  absent deliver at 34 weeks. ----------------------------------------------------------------------               Lin Landsman, MD Electronically Signed Final Report   04/25/2019 12:59 pm ----------------------------------------------------------------------  [place] Mfm Ua Cord Doppler  Result Date: 04/18/2019 ----------------------------------------------------------------------  OBSTETRICS REPORT                       (Signed Final 04/18/2019 10:51 am) ---------------------------------------------------------------------- Patient Info  ID #:       [id number]                          D.O.B.:  01-10-1986 (33 yrs)  Name:       Traci Barron                   Visit Date: 04/18/2019 07:52 am ---------------------------------------------------------------------- Performed By  Performed By:     Hurman Horn          Ref. Address:     [address]                                                             [place], [place]                                                             [address]  Attending:        Noralee Space MD        [facility]  Referred  By:      Allie Bossier MD ---------------------------------------------------------------------- Orders   #  Description                          Code         Ordered By   1  [place] MFM OB FOLLOW UP                  76816.01     RAVI SHANKAR   2  [place] MFM UA CORD DOPPLER               76820.02     [facility]  ----------------------------------------------------------------------    #  Order #                    Accession #                 Episode #   1  [id number]                  [id number]                  [id number]   2  [id number]                  [id number]                  [id number]  ---------------------------------------------------------------------- Indications   Encounter for other antenatal screening        Z36.2   follow-up   Obesity complicating pregnancy, second         O99.212   trimester   Advanced maternal age multigravida 90+,        O63.522   second trimester   Heart disease in mother affecting pregnancy    O99.412   in second trimester (mitral valve prolapse)   Uterine fibroids                               O34.10   Abnormal finding on antenatal screening        O28.9   (shortened long bones)   Placenta previa specified as without           O44.02   hemorrhage, second trimester   Small for gestational age fetus affecting      O26.5990   management of mother   [redacted] weeks gestation of pregnancy                Z3A.27  ---------------------------------------------------------------------- Vital Signs                                                 Height:        5'6" ---------------------------------------------------------------------- Fetal Evaluation  Num Of Fetuses:         1  Fetal Heart Rate(bpm):  132  Cardiac Activity:       Observed  Presentation:           Cephalic  Placenta:               Posterior Previa  Amniotic Fluid  AFI FV:      Within normal limits  Largest Pocket(cm)                              5.2 ---------------------------------------------------------------------- Biometry  BPD:      67.2  mm     G. Age:  27w 0d         16  %    CI:        69.69   %    70 - 86                                                          FL/HC:      16.8   %    18.8 - 20.6  HC:      256.9  mm     G. Age:  27w 6d         22  %    HC/AC:      1.13        1.05 - 1.21  AC:       227   mm     G. Age:  27w 1d         20  %    FL/BPD:     64.3   %    71 - 87   FL:       43.2  mm     G. Age:  24w 1d        < 1  %    FL/AC:      19.0   %    20 - 24  HUM:      41.6  mm     G. Age:  25w 1d        < 5  %  LV:        8.6  mm  Est. FW:     897  gm           2 lb      3  % ---------------------------------------------------------------------- OB History  Gravidity:    1         Term:   0        Prem:   0        SAB:   0  TOP:          0       Ectopic:  0        Living: 0 ---------------------------------------------------------------------- Gestational Age  LMP:           29w 0d        Date:  09/27/18                 EDD:   07/04/19  U/S Today:     26w 4d                                        EDD:   07/21/19  Best:          27w 6d     Det. ByMarcella Dubs         EDD:   07/12/19                                      (  12/08/18) ---------------------------------------------------------------------- Anatomy  Cranium:               Appears normal         Aortic Arch:            Previously seen  Cavum:                 Appears normal         Ductal Arch:            Previously seen  Ventricles:            Appears normal         Diaphragm:              Appears normal  Choroid Plexus:        Previously seen        Stomach:                Appears normal, left                                                                        sided  Cerebellum:            Previously seen        Abdomen:                Appears normal  Posterior Fossa:       Previously seen        Abdominal Wall:         Previously seen  Nuchal Fold:           Previously seen        Cord Vessels:           Previously seen  Face:                  Orbits and profile     Kidneys:                Appear normal                         previously seen  Lips:                  Appears normal         Bladder:                Appears normal  Thoracic:              Appears normal         Spine:                  Previously seen  Heart:                 Appears normal         Upper Extremities:      Previously seen                          (4CH, axis, and                         situs)  RVOT:  Appears normal         Lower Extremities:      Previously seen  LVOT:                  Appears normal ---------------------------------------------------------------------- Doppler - Fetal Vessels  Umbilical Artery                                                            ADFV    RDFV                                                              Yes      No ---------------------------------------------------------------------- Cervix Uterus Adnexa  Cervix  Length:           3.97  cm.  Normal appearance by transabdominal scan.  Uterus  Multiple fibroids noted, see table below.  Left Ovary  Not visualized.  Right Ovary  Not visualized.  Adnexa  No abnormality visualized. ---------------------------------------------------------------------- Impression  Patient returned for fetal growth assessment. On previous  ultrasound, femur and humerus measured at less than the  5th percentiles. She also has placenta previa with no history  of vaginal bleeding.  She does not have hypertension and blood pressure at our  office is 128/83 mm Hg. Patient will be undergoing screening  for gestational diabetes at your office next week.  On ultrasound, the estimated fetal weight is at the 3rd  percentile. Amniotic fluid is normal and good fetal activity is  seen. Femur and humerus measure at less than the 5th  percentiles. Umbilical artery (UA) Doppler was performed  because of the finding of fetal growth restriction. Intermittent  absent-end diastolic flow is seen.  On transabdominal scan, placenta previa covering the  internal os is seen.  NST is reactive.  I explained fetal growth restriction and the significance of  antenatal testing including Doppler studies. I explained more-  frequent fetal surveillance (twice-weekly from next week).  I counseled her on the benefit of antenatal corticosteroids  (betamethasone) for fetal lung maturity.  Timing of delivery  is dependent on progression of UA Doppler  and other modes of antenatal testing. Early delivery (34  weeks if absent and 32 weeks if reversed) would be  recommended.  We made appointments for follow-up ultrasound.  Patient has an appointment with her Ob next week (08/25). I  advised her to undergo screening for gestational diabetes  BEFORE steroid treatment. If she gets the first dose on  08/25, she will be given the second dose on 8/26. ---------------------------------------------------------------------- Recommendations  -Follow-up UA Doppler and NST next week.  -UA Doppler on Tue-Fri.  -NST on Tue.  -Fetal growth in 3 weeks.  -Betamethasone next week.  -Screening for GDM before initiating steroids.  -Husband to accompany Ms. Mannina on her next visit to [facility]. ----------------------------------------------------------------------                  Noralee Space, MD Electronically Signed Final Report   04/18/2019 10:51 am ----------------------------------------------------------------------   Assessment and Plan:  Pregnancy: G1P0 at [redacted]w[redacted]d 1. Placenta previa in third trimester --Posterior previa  continues on today's [place]   2. Abnormal fetal ultrasound --See IUGR below  3. Obesity in pregnancy   4. Supervision of other normal pregnancy, antepartum --Pt reports good fetal movement, denies cramping, LOF, or vaginal bleeding --Anticipatory guidance about next visits/weeks of pregnancy given. --Discussed delivery plans, as pt trying to plan with work.  With a abnormal dopplers, preterm delivery at 34 or even 32 with reversed flow is recommended by MFM but if dopplers remain normal, pt wants to know if delivery will be at 37 weeks. --Message to Dr Grace Bushy with MFM to clarify  5. Intrauterine growth restriction (IUGR) affecting care of mother, third trimester, single or unspecified fetus ----IUGR with shortened long bones, antenatal testing and twice weekly dopplers  --S/P BMZ on 8/24 and 8/24  Preterm  labor symptoms and general obstetric precautions including but not limited to vaginal bleeding, contractions, leaking of fluid and fetal movement were reviewed in detail with the patient. I discussed the assessment and treatment plan with the patient. The patient was provided an opportunity to ask questions and all were answered. The patient agreed with the plan and demonstrated an understanding of the instructions. The patient was advised to call back or seek an in-person office evaluation/go to MAU at [facility] for any urgent or concerning symptoms. Please refer to After Visit Summary for other counseling recommendations.   I provided 10 minutes of face-to-face time during this encounter.  No follow-ups on file.  Future Appointments  Date Time Provider Department Center  05/12/2019  7:45 AM WH-MFC NURSE WH-MFC MFC-US  05/12/2019  7:45 AM WH-MFC [place] 2 WH-MFCUS MFC-US  05/12/2019  8:15 AM WH-MFC NST WH-MFC MFC-US  05/16/2019  7:45 AM WH-MFC NURSE WH-MFC MFC-US  05/16/2019  7:45 AM WH-MFC [place] 2 WH-MFCUS MFC-US  05/16/2019  8:15 AM WH-MFC NST WH-MFC MFC-US  05/19/2019  8:00 AM WH-MFC NURSE WH-MFC MFC-US  05/19/2019  8:00 AM WH-MFC [place] 3 WH-MFCUS MFC-US  05/19/2019  8:30 AM WH-MFC NST WH-MFC MFC-US  05/23/2019  8:30 AM WH-MFC NURSE WH-MFC MFC-US  05/23/2019  8:30 AM WH-MFC [place] 5 WH-MFCUS MFC-US  05/23/2019  9:00 AM WH-MFC NST WH-MFC MFC-US  05/26/2019  8:00 AM WH-MFC NURSE WH-MFC MFC-US  05/26/2019  8:00 AM WH-MFC [place] 3 WH-MFCUS MFC-US  05/26/2019  9:00 AM WH-MFC NST WH-MFC MFC-US    Sharen Counter, CNM [facility] [employer], [facility]

## 2019-05-12 ENCOUNTER — Other Ambulatory Visit (HOSPITAL_COMMUNITY): Payer: Self-pay | Admitting: *Deleted

## 2019-05-12 ENCOUNTER — Ambulatory Visit (HOSPITAL_COMMUNITY): Payer: PRIVATE HEALTH INSURANCE | Admitting: *Deleted

## 2019-05-12 ENCOUNTER — Encounter (HOSPITAL_COMMUNITY): Payer: Self-pay

## 2019-05-12 ENCOUNTER — Other Ambulatory Visit: Payer: Self-pay

## 2019-05-12 ENCOUNTER — Ambulatory Visit (HOSPITAL_COMMUNITY)
Admission: RE | Admit: 2019-05-12 | Discharge: 2019-05-12 | Disposition: A | Discharge status: Home / Self Care | Payer: PRIVATE HEALTH INSURANCE | Source: Ambulatory Visit | Attending: Obstetrics and Gynecology | Admitting: Obstetrics and Gynecology

## 2019-05-12 VITALS — BP 132/92 | HR 85 | Temp 98.2°F

## 2019-05-12 DIAGNOSIS — O99210 Obesity complicating pregnancy, unspecified trimester: Secondary | ICD-10-CM | POA: Diagnosis present

## 2019-05-12 DIAGNOSIS — O3413 Maternal care for benign tumor of corpus uteri, third trimester: Secondary | ICD-10-CM | POA: Diagnosis not present

## 2019-05-12 DIAGNOSIS — O4403 Placenta previa specified as without hemorrhage, third trimester: Secondary | ICD-10-CM | POA: Diagnosis not present

## 2019-05-12 DIAGNOSIS — O99413 Diseases of the circulatory system complicating pregnancy, third trimester: Secondary | ICD-10-CM

## 2019-05-12 DIAGNOSIS — O283 Abnormal ultrasonic finding on antenatal screening of mother: Secondary | ICD-10-CM

## 2019-05-12 DIAGNOSIS — O365930 Maternal care for other known or suspected poor fetal growth, third trimester, not applicable or unspecified: Secondary | ICD-10-CM

## 2019-05-12 DIAGNOSIS — Z3A31 31 weeks gestation of pregnancy: Secondary | ICD-10-CM

## 2019-05-12 DIAGNOSIS — O289 Unspecified abnormal findings on antenatal screening of mother: Secondary | ICD-10-CM

## 2019-05-12 DIAGNOSIS — O09523 Supervision of elderly multigravida, third trimester: Secondary | ICD-10-CM | POA: Diagnosis not present

## 2019-05-12 NOTE — Procedures (Signed)
Traci Barron 1986-03-03 [redacted]w[redacted]d  Fetus A Non-Stress Test Interpretation for 05/12/19  Indication: IUGR  Fetal Heart Rate A Mode: External Baseline Rate (A): 145 bpm Variability: Moderate Accelerations: 10 x 10 Decelerations: None Multiple birth?: No  Uterine Activity Mode: Toco Contraction Frequency (min): none noted  Interpretation (Fetal Testing) Nonstress Test Interpretation: Reactive Comments: FHR tracing rev'd by Dr. Donalee Citrin

## 2019-05-15 ENCOUNTER — Ambulatory Visit: Payer: PRIVATE HEALTH INSURANCE | Admitting: Internal Medicine

## 2019-05-16 ENCOUNTER — Other Ambulatory Visit: Payer: Self-pay

## 2019-05-16 ENCOUNTER — Ambulatory Visit (HOSPITAL_COMMUNITY): Payer: PRIVATE HEALTH INSURANCE | Admitting: *Deleted

## 2019-05-16 ENCOUNTER — Encounter (HOSPITAL_COMMUNITY): Payer: Self-pay

## 2019-05-16 ENCOUNTER — Ambulatory Visit (HOSPITAL_COMMUNITY)
Admission: RE | Admit: 2019-05-16 | Discharge: 2019-05-16 | Disposition: A | Discharge status: Home / Self Care | Payer: PRIVATE HEALTH INSURANCE | Source: Ambulatory Visit | Attending: Obstetrics and Gynecology | Admitting: Obstetrics and Gynecology

## 2019-05-16 VITALS — BP 140/85

## 2019-05-16 DIAGNOSIS — O09523 Supervision of elderly multigravida, third trimester: Secondary | ICD-10-CM | POA: Diagnosis not present

## 2019-05-16 DIAGNOSIS — O365930 Maternal care for other known or suspected poor fetal growth, third trimester, not applicable or unspecified: Secondary | ICD-10-CM | POA: Diagnosis present

## 2019-05-16 DIAGNOSIS — O283 Abnormal ultrasonic finding on antenatal screening of mother: Secondary | ICD-10-CM | POA: Insufficient documentation

## 2019-05-16 DIAGNOSIS — O3413 Maternal care for benign tumor of corpus uteri, third trimester: Secondary | ICD-10-CM | POA: Diagnosis not present

## 2019-05-16 DIAGNOSIS — Z3403 Encounter for supervision of normal first pregnancy, third trimester: Secondary | ICD-10-CM

## 2019-05-16 DIAGNOSIS — O4403 Placenta previa specified as without hemorrhage, third trimester: Secondary | ICD-10-CM | POA: Insufficient documentation

## 2019-05-16 DIAGNOSIS — O99413 Diseases of the circulatory system complicating pregnancy, third trimester: Secondary | ICD-10-CM

## 2019-05-16 DIAGNOSIS — O99210 Obesity complicating pregnancy, unspecified trimester: Secondary | ICD-10-CM | POA: Diagnosis present

## 2019-05-16 DIAGNOSIS — Z3A31 31 weeks gestation of pregnancy: Secondary | ICD-10-CM

## 2019-05-16 DIAGNOSIS — O289 Unspecified abnormal findings on antenatal screening of mother: Secondary | ICD-10-CM

## 2019-05-16 NOTE — Procedures (Signed)
Traci Barron [birth year] [redacted]w[redacted]d  Fetus A Non-Stress Test Interpretation for 05/16/19  Indication: IUGR  Fetal Heart Rate A Mode: External Baseline Rate (A): 145 bpm Variability: Moderate Accelerations: 10 x 10 Decelerations: None Multiple birth?: No  Uterine Activity Mode: Palpation, Toco Contraction Frequency (min): None Resting Tone Palpated: Relaxed Resting Time: Adequate  Interpretation (Fetal Testing) Nonstress Test Interpretation: Reactive Comments: EFM tracing reviewed by Dr. Annamaria Boots

## 2019-05-19 ENCOUNTER — Ambulatory Visit (HOSPITAL_COMMUNITY): Payer: PRIVATE HEALTH INSURANCE | Admitting: *Deleted

## 2019-05-19 ENCOUNTER — Ambulatory Visit (HOSPITAL_COMMUNITY)
Admission: RE | Admit: 2019-05-19 | Discharge: 2019-05-19 | Disposition: A | Discharge status: Home / Self Care | Payer: PRIVATE HEALTH INSURANCE | Source: Ambulatory Visit | Attending: Obstetrics and Gynecology | Admitting: Obstetrics and Gynecology

## 2019-05-19 ENCOUNTER — Encounter (HOSPITAL_COMMUNITY): Payer: Self-pay

## 2019-05-19 ENCOUNTER — Other Ambulatory Visit: Payer: Self-pay

## 2019-05-19 VITALS — BP 126/83 | HR 75

## 2019-05-19 DIAGNOSIS — O283 Abnormal ultrasonic finding on antenatal screening of mother: Secondary | ICD-10-CM

## 2019-05-19 DIAGNOSIS — O09523 Supervision of elderly multigravida, third trimester: Secondary | ICD-10-CM | POA: Diagnosis not present

## 2019-05-19 DIAGNOSIS — O99210 Obesity complicating pregnancy, unspecified trimester: Secondary | ICD-10-CM

## 2019-05-19 DIAGNOSIS — O3413 Maternal care for benign tumor of corpus uteri, third trimester: Secondary | ICD-10-CM

## 2019-05-19 DIAGNOSIS — O289 Unspecified abnormal findings on antenatal screening of mother: Secondary | ICD-10-CM

## 2019-05-19 DIAGNOSIS — O365930 Maternal care for other known or suspected poor fetal growth, third trimester, not applicable or unspecified: Secondary | ICD-10-CM | POA: Diagnosis not present

## 2019-05-19 DIAGNOSIS — O4403 Placenta previa specified as without hemorrhage, third trimester: Secondary | ICD-10-CM | POA: Diagnosis not present

## 2019-05-19 DIAGNOSIS — O365990 Maternal care for other known or suspected poor fetal growth, unspecified trimester, not applicable or unspecified: Secondary | ICD-10-CM

## 2019-05-19 DIAGNOSIS — Z3A32 32 weeks gestation of pregnancy: Secondary | ICD-10-CM

## 2019-05-19 DIAGNOSIS — O99413 Diseases of the circulatory system complicating pregnancy, third trimester: Secondary | ICD-10-CM

## 2019-05-19 NOTE — Procedures (Signed)
Traci Barron 05-24-1986 [redacted]w[redacted]d  Fetus A Non-Stress Test Interpretation for 05/19/19  Indication: IUGR  Fetal Heart Rate A Mode: External Baseline Rate (A): 150 bpm Variability: Moderate Accelerations: 15 x 15 Decelerations: None Multiple birth?: No  Uterine Activity Mode: Palpation, Toco Contraction Frequency (min): x1 Contraction Duration (sec): 50 Contraction Quality: Mild(pt denies feeling) Resting Tone Palpated: Relaxed Resting Time: Adequate  Interpretation (Fetal Testing) Nonstress Test Interpretation: Reactive Overall Impression: Reassuring for gestational age Comments: EFM tracing reviewed by Dr. Annamaria Boots

## 2019-05-23 ENCOUNTER — Encounter (HOSPITAL_COMMUNITY): Payer: Self-pay

## 2019-05-23 ENCOUNTER — Telehealth (INDEPENDENT_AMBULATORY_CARE_PROVIDER_SITE_OTHER): Payer: PRIVATE HEALTH INSURANCE | Admitting: Obstetrics and Gynecology

## 2019-05-23 ENCOUNTER — Other Ambulatory Visit (HOSPITAL_COMMUNITY): Payer: Self-pay | Admitting: Maternal & Fetal Medicine

## 2019-05-23 ENCOUNTER — Other Ambulatory Visit (HOSPITAL_COMMUNITY): Payer: Self-pay | Admitting: *Deleted

## 2019-05-23 ENCOUNTER — Ambulatory Visit (HOSPITAL_COMMUNITY)
Admission: RE | Admit: 2019-05-23 | Discharge: 2019-05-23 | Disposition: A | Discharge status: Home / Self Care | Payer: PRIVATE HEALTH INSURANCE | Source: Ambulatory Visit | Attending: Obstetrics and Gynecology | Admitting: Obstetrics and Gynecology

## 2019-05-23 ENCOUNTER — Other Ambulatory Visit: Payer: Self-pay

## 2019-05-23 ENCOUNTER — Telehealth: Payer: PRIVATE HEALTH INSURANCE | Admitting: Internal Medicine

## 2019-05-23 ENCOUNTER — Ambulatory Visit (HOSPITAL_COMMUNITY): Payer: PRIVATE HEALTH INSURANCE | Admitting: *Deleted

## 2019-05-23 VITALS — BP 139/83

## 2019-05-23 VITALS — BP 139/83 | HR 75

## 2019-05-23 DIAGNOSIS — O4403 Placenta previa specified as without hemorrhage, third trimester: Secondary | ICD-10-CM

## 2019-05-23 DIAGNOSIS — O283 Abnormal ultrasonic finding on antenatal screening of mother: Secondary | ICD-10-CM

## 2019-05-23 DIAGNOSIS — O365930 Maternal care for other known or suspected poor fetal growth, third trimester, not applicable or unspecified: Secondary | ICD-10-CM

## 2019-05-23 DIAGNOSIS — O99210 Obesity complicating pregnancy, unspecified trimester: Secondary | ICD-10-CM | POA: Diagnosis present

## 2019-05-23 DIAGNOSIS — Z3A32 32 weeks gestation of pregnancy: Secondary | ICD-10-CM

## 2019-05-23 DIAGNOSIS — O3413 Maternal care for benign tumor of corpus uteri, third trimester: Secondary | ICD-10-CM | POA: Diagnosis not present

## 2019-05-23 DIAGNOSIS — O0993 Supervision of high risk pregnancy, unspecified, third trimester: Secondary | ICD-10-CM

## 2019-05-23 DIAGNOSIS — O289 Unspecified abnormal findings on antenatal screening of mother: Secondary | ICD-10-CM

## 2019-05-23 DIAGNOSIS — O99413 Diseases of the circulatory system complicating pregnancy, third trimester: Secondary | ICD-10-CM

## 2019-05-23 DIAGNOSIS — O0990 Supervision of high risk pregnancy, unspecified, unspecified trimester: Secondary | ICD-10-CM

## 2019-05-23 DIAGNOSIS — O133 Gestational [pregnancy-induced] hypertension without significant proteinuria, third trimester: Secondary | ICD-10-CM

## 2019-05-23 DIAGNOSIS — O139 Gestational [pregnancy-induced] hypertension without significant proteinuria, unspecified trimester: Secondary | ICD-10-CM | POA: Insufficient documentation

## 2019-05-23 DIAGNOSIS — O09523 Supervision of elderly multigravida, third trimester: Secondary | ICD-10-CM | POA: Diagnosis not present

## 2019-05-23 DIAGNOSIS — O99213 Obesity complicating pregnancy, third trimester: Secondary | ICD-10-CM

## 2019-05-23 NOTE — Procedures (Signed)
Traci Barron 08-28-1986 [redacted]w[redacted]d  Fetus A Non-Stress Test Interpretation for 05/23/19  Indication: IUGR  Fetal Heart Rate A Mode: External Baseline Rate (A): 145 bpm Variability: Moderate Accelerations: 15 x 15 Decelerations: None Multiple birth?: No  Uterine Activity Mode: Palpation, Toco Contraction Frequency (min): None Resting Tone Palpated: Relaxed Resting Time: Adequate  Interpretation (Fetal Testing) Nonstress Test Interpretation: Reactive Comments: EFM tracing reviewed by Dr. Gertie Exon

## 2019-05-23 NOTE — Progress Notes (Signed)
TELEHEALTH VIRTUAL OBSTETRICS VISIT ENCOUNTER NOTE  I connected with Traci Barron on 05/23/19 at  3:15 PM EDT by telephone at home and verified that I am speaking with the correct person using two identifiers.   I discussed the limitations, risks, security and privacy concerns of performing an evaluation and management service by telephone and the availability of in person appointments. I also discussed with the patient that there may be a patient responsible charge related to this service. The patient expressed understanding and agreed to proceed.  Subjective:  Traci Barron is a 33 y.o. G1P0 at [redacted]w[redacted]d being followed for ongoing prenatal care.  She is currently monitored for the following issues for this high-risk pregnancy and has Mitral valve disease; Supervision of normal pregnancy; Obesity in pregnancy; AMA (advanced maternal age) multigravida 35+; Placenta previa; Abnormal fetal ultrasound; Pregnancy affected by fetal growth restriction; and Gestational hypertension affecting first pregnancy on their problem list.  Patient reports no complaints. Reports fetal movement. Denies any contractions, bleeding or leaking of fluid.   The following portions of the patient's history were reviewed and updated as appropriate: allergies, current medications, past family history, past medical history, past social history, past surgical history and problem list.   Objective:   General:  Alert, oriented and cooperative.   Mental Status: Normal mood and affect perceived. Normal judgment and thought content.  Rest of physical exam deferred due to type of encounter  Assessment and Plan:  Pregnancy: G1P0 at [redacted]w[redacted]d  1. Placenta previa in third trimester  Now considered low lying.  [place] Low lying placenta 1-1.8 cm from internal os- not seen today likely resolved.  Assess placental location by transvaginal ultrasound at 34 weeks per MFM No intercourse, pelvic rest until told otherwise.   2. Abnormal fetal  ultrasound  Continue bi-weekly MFM [place] with fetal dopplers Discussed importance of fetal kick counts  3. Obesity in pregnancy  Not on ASA   4. Supervision of high risk pregnancy, antepartum  MD schedule for next visit   5. Gestational hypertension affecting first pregnancy  Asymptomatic: BP today with MFM: 139/83 Several elevated BP visits; mostly at MFM: 8/25: 144/79, 9/11: 132/91, 9/15: 146/89 Discussed recommendation is delivery at 37 weeks She needs to come to the office this week for [facility] labs.  Discussed warning signs in detail.  Continue atenatal testing with MFM    Preterm labor symptoms and general obstetric precautions including but not limited to vaginal bleeding, contractions, leaking of fluid and fetal movement were reviewed in detail with the patient.  I discussed the assessment and treatment plan with the patient. The patient was provided an opportunity to ask questions and all were answered. The patient agreed with the plan and demonstrated an understanding of the instructions. The patient was advised to call back or seek an in-person office evaluation/go to MAU at [facility] for any urgent or concerning symptoms. Please refer to After Visit Summary for other counseling recommendations.   I provided 15 minutes of non-face-to-face time during this encounter.  Return MD only- high risk.  Future Appointments  Date Time Provider [place]  05/26/2019  8:00 AM [place] MFC-US  05/26/2019  8:00 AM WH-MFC [place] 3 WH-MFCUS MFC-US  05/26/2019  9:00 AM WH-MFC NST [place] MFC-US  05/30/2019  7:40 AM WH-MFC NURSE WH-MFC MFC-US  05/30/2019  7:45 AM WH-MFC [place] 1 WH-MFCUS MFC-US  05/30/2019  8:30 AM WH-MFC NST [place] MFC-US  06/02/2019  7:40 AM WH-MFC NURSE WH-MFC MFC-US  06/02/2019  7:45 AM WH-MFC [place] 1 WH-MFCUS MFC-US  06/02/2019  8:45 AM WH-MFC NST WH-MFC MFC-US  06/06/2019  8:00 AM WH-MFC NURSE WH-MFC MFC-US  06/06/2019  8:00 AM WH-MFC [place] 3 WH-MFCUS  MFC-US  06/06/2019  8:45 AM WH-MFC NST WH-MFC MFC-US  06/09/2019  7:40 AM WH-MFC NURSE WH-MFC MFC-US  06/09/2019  7:45 AM WH-MFC [place] 1 WH-MFCUS MFC-US  06/09/2019  8:45 AM WH-MFC NST WH-MFC MFC-US    Venia Carbon, NP [facility] [employer], [facility]

## 2019-05-24 ENCOUNTER — Ambulatory Visit (INDEPENDENT_AMBULATORY_CARE_PROVIDER_SITE_OTHER): Payer: PRIVATE HEALTH INSURANCE | Admitting: *Deleted

## 2019-05-24 DIAGNOSIS — O0990 Supervision of high risk pregnancy, unspecified, unspecified trimester: Secondary | ICD-10-CM | POA: Diagnosis not present

## 2019-05-24 DIAGNOSIS — O139 Gestational [pregnancy-induced] hypertension without significant proteinuria, unspecified trimester: Secondary | ICD-10-CM

## 2019-05-24 NOTE — Progress Notes (Signed)
Pt here for [facility] labs only

## 2019-05-25 LAB — CBC
HCT: 39.1 % (ref 35.0–45.0)
Hemoglobin: 13.2 g/dL (ref 11.7–15.5)
MCH: 32.2 pg (ref 27.0–33.0)
MCHC: 33.8 g/dL (ref 32.0–36.0)
MCV: 95.4 fL (ref 80.0–100.0)
MPV: 9.9 fL (ref 7.5–12.5)
Platelets: 284 10*3/uL (ref 140–400)
RBC: 4.1 10*6/uL (ref 3.80–5.10)
RDW: 13.3 % (ref 11.0–15.0)
WBC: 10.7 10*3/uL (ref 3.8–10.8)

## 2019-05-25 LAB — COMPREHENSIVE METABOLIC PANEL
AG Ratio: 1.5 (calc) (ref 1.0–2.5)
ALT: 10 U/L (ref 6–29)
AST: 14 U/L (ref 10–30)
Albumin: 3.5 g/dL — ABNORMAL LOW (ref 3.6–5.1)
Alkaline phosphatase (APISO): 100 U/L (ref 31–125)
BUN: 10 mg/dL (ref 7–25)
CO2: 24 mmol/L (ref 20–32)
Calcium: 9.4 mg/dL (ref 8.6–10.2)
Chloride: 105 mmol/L (ref 98–110)
Creat: 0.61 mg/dL (ref 0.50–1.10)
Globulin: 2.4 g/dL (calc) (ref 1.9–3.7)
Glucose, Bld: 71 mg/dL (ref 65–99)
Potassium: 4 mmol/L (ref 3.5–5.3)
Sodium: 135 mmol/L (ref 135–146)
Total Bilirubin: 0.2 mg/dL (ref 0.2–1.2)
Total Protein: 5.9 g/dL — ABNORMAL LOW (ref 6.1–8.1)

## 2019-05-25 LAB — PROTEIN / CREATININE RATIO, URINE
Creatinine, Urine: 35 mg/dL (ref 20–275)
Protein/Creat Ratio: 229 mg/g creat — ABNORMAL HIGH (ref 21–161)
Protein/Creatinine Ratio: 0.229 mg/mg creat — ABNORMAL HIGH (ref 0.021–0.16)
Total Protein, Urine: 8 mg/dL (ref 5–24)

## 2019-05-26 ENCOUNTER — Encounter (HOSPITAL_COMMUNITY): Payer: Self-pay

## 2019-05-26 ENCOUNTER — Encounter (HOSPITAL_COMMUNITY): Payer: Self-pay | Admitting: *Deleted

## 2019-05-26 ENCOUNTER — Other Ambulatory Visit (HOSPITAL_COMMUNITY): Payer: Self-pay | Admitting: Maternal & Fetal Medicine

## 2019-05-26 ENCOUNTER — Ambulatory Visit (HOSPITAL_COMMUNITY): Payer: No Typology Code available for payment source | Admitting: *Deleted

## 2019-05-26 ENCOUNTER — Inpatient Hospital Stay (HOSPITAL_COMMUNITY)
Admission: AD | Admit: 2019-05-26 | Discharge: 2019-05-26 | Disposition: A | Discharge status: Home / Self Care | Payer: No Typology Code available for payment source | Attending: Obstetrics and Gynecology | Admitting: Obstetrics and Gynecology

## 2019-05-26 ENCOUNTER — Other Ambulatory Visit: Payer: Self-pay

## 2019-05-26 ENCOUNTER — Ambulatory Visit (HOSPITAL_BASED_OUTPATIENT_CLINIC_OR_DEPARTMENT_OTHER)
Admission: RE | Admit: 2019-05-26 | Discharge: 2019-05-26 | Disposition: A | Discharge status: Home / Self Care | Payer: No Typology Code available for payment source | Source: Ambulatory Visit | Attending: Obstetrics and Gynecology | Admitting: Obstetrics and Gynecology

## 2019-05-26 DIAGNOSIS — O3413 Maternal care for benign tumor of corpus uteri, third trimester: Secondary | ICD-10-CM

## 2019-05-26 DIAGNOSIS — O99413 Diseases of the circulatory system complicating pregnancy, third trimester: Secondary | ICD-10-CM | POA: Insufficient documentation

## 2019-05-26 DIAGNOSIS — O4403 Placenta previa specified as without hemorrhage, third trimester: Secondary | ICD-10-CM

## 2019-05-26 DIAGNOSIS — O09513 Supervision of elderly primigravida, third trimester: Secondary | ICD-10-CM | POA: Diagnosis not present

## 2019-05-26 DIAGNOSIS — O09523 Supervision of elderly multigravida, third trimester: Secondary | ICD-10-CM | POA: Diagnosis not present

## 2019-05-26 DIAGNOSIS — O1400 Mild to moderate pre-eclampsia, unspecified trimester: Secondary | ICD-10-CM

## 2019-05-26 DIAGNOSIS — Z3A33 33 weeks gestation of pregnancy: Secondary | ICD-10-CM

## 2019-05-26 DIAGNOSIS — O365990 Maternal care for other known or suspected poor fetal growth, unspecified trimester, not applicable or unspecified: Secondary | ICD-10-CM

## 2019-05-26 DIAGNOSIS — O99213 Obesity complicating pregnancy, third trimester: Secondary | ICD-10-CM

## 2019-05-26 DIAGNOSIS — O1403 Mild to moderate pre-eclampsia, third trimester: Secondary | ICD-10-CM

## 2019-05-26 DIAGNOSIS — O283 Abnormal ultrasonic finding on antenatal screening of mother: Secondary | ICD-10-CM

## 2019-05-26 DIAGNOSIS — O365930 Maternal care for other known or suspected poor fetal growth, third trimester, not applicable or unspecified: Secondary | ICD-10-CM | POA: Insufficient documentation

## 2019-05-26 DIAGNOSIS — O289 Unspecified abnormal findings on antenatal screening of mother: Secondary | ICD-10-CM | POA: Diagnosis not present

## 2019-05-26 DIAGNOSIS — O4443 Low lying placenta NOS or without hemorrhage, third trimester: Secondary | ICD-10-CM

## 2019-05-26 DIAGNOSIS — O4440 Low lying placenta NOS or without hemorrhage, unspecified trimester: Secondary | ICD-10-CM

## 2019-05-26 DIAGNOSIS — O99210 Obesity complicating pregnancy, unspecified trimester: Secondary | ICD-10-CM | POA: Insufficient documentation

## 2019-05-26 DIAGNOSIS — I341 Nonrheumatic mitral (valve) prolapse: Secondary | ICD-10-CM | POA: Insufficient documentation

## 2019-05-26 DIAGNOSIS — O133 Gestational [pregnancy-induced] hypertension without significant proteinuria, third trimester: Secondary | ICD-10-CM | POA: Diagnosis not present

## 2019-05-26 LAB — CBC
HCT: 36.7 % (ref 36.0–46.0)
Hemoglobin: 13.5 g/dL (ref 12.0–15.0)
MCH: 33.5 pg (ref 26.0–34.0)
MCHC: 36.8 g/dL — ABNORMAL HIGH (ref 30.0–36.0)
MCV: 91.1 fL (ref 80.0–100.0)
Platelets: 275 10*3/uL (ref 150–400)
RBC: 4.03 MIL/uL (ref 3.87–5.11)
RDW: 13.3 % (ref 11.5–15.5)
WBC: 9.9 10*3/uL (ref 4.0–10.5)
nRBC: 0 % (ref 0.0–0.2)

## 2019-05-26 LAB — COMPREHENSIVE METABOLIC PANEL
ALT: 13 U/L (ref 0–44)
AST: 18 U/L (ref 15–41)
Albumin: 3 g/dL — ABNORMAL LOW (ref 3.5–5.0)
Alkaline Phosphatase: 100 U/L (ref 38–126)
Anion gap: 12 (ref 5–15)
BUN: 7 mg/dL (ref 6–20)
CO2: 19 mmol/L — ABNORMAL LOW (ref 22–32)
Calcium: 9.9 mg/dL (ref 8.9–10.3)
Chloride: 106 mmol/L (ref 98–111)
Creatinine, Ser: 0.58 mg/dL (ref 0.44–1.00)
GFR calc Af Amer: >60 mL/min (ref >60)
GFR calc non Af Amer: >60 mL/min (ref >60)
Glucose, Bld: 77 mg/dL (ref 70–99)
Potassium: 4 mmol/L (ref 3.5–5.1)
Sodium: 137 mmol/L (ref 135–145)
Total Bilirubin: 0.2 mg/dL — ABNORMAL LOW (ref 0.3–1.2)
Total Protein: 5.8 g/dL — ABNORMAL LOW (ref 6.5–8.1)

## 2019-05-26 LAB — URINALYSIS, ROUTINE W REFLEX MICROSCOPIC
Bacteria, UA: NONE SEEN
Bilirubin Urine: NEGATIVE
Glucose, UA: NEGATIVE mg/dL
Hgb urine dipstick: NEGATIVE
Ketones, ur: NEGATIVE mg/dL
Nitrite: NEGATIVE
Protein, ur: NEGATIVE mg/dL
Specific Gravity, Urine: 1.003 — ABNORMAL LOW (ref 1.005–1.030)
pH: 8 (ref 5.0–8.0)

## 2019-05-26 LAB — PROTEIN / CREATININE RATIO, URINE
Creatinine, Urine: 16.64 mg/dL
Protein Creatinine Ratio: 0.36 mg/mg{Cre} — ABNORMAL HIGH (ref 0.00–0.15)
Total Protein, Urine: 6 mg/dL

## 2019-05-26 MED ORDER — ACETAMINOPHEN 325 MG PO TABS
650.0000 mg | ORAL_TABLET | Freq: Once | ORAL | Status: AC
  Administered 2019-05-26: 11:00:00 650 mg via ORAL
  Filled 2019-05-26: qty 2

## 2019-05-26 NOTE — Progress Notes (Signed)
Report given to Charge RN in MAU.  Pt being sent to MAU for further evaluation per Dr. Farrell Ours after reactive NST today.

## 2019-05-26 NOTE — Discharge Instructions (Signed)
Preeclampsia and Eclampsia °Preeclampsia is a serious condition that may develop during pregnancy. This condition causes high blood pressure and increased protein in your urine along with other symptoms, such as headaches and vision changes. These symptoms may develop as the condition gets worse. Preeclampsia may occur at 20 weeks of pregnancy or later. °Diagnosing and treating preeclampsia early is very important. If not treated early, it can cause serious problems for you and your baby. One problem it can lead to is eclampsia. Eclampsia is a condition that causes muscle jerking or shaking (convulsions or seizures) and other serious problems for the mother. During pregnancy, delivering your baby may be the best treatment for preeclampsia or eclampsia. For most women, preeclampsia and eclampsia symptoms go away after giving birth. °In rare cases, a woman may develop preeclampsia after giving birth (postpartum preeclampsia). This usually occurs within 48 hours after childbirth but may occur up to 6 weeks after giving birth. °What are the causes? °The cause of preeclampsia is not known. °What increases the risk? °The following risk factors make you more likely to develop preeclampsia: °· Being pregnant for the first time. °· Having had preeclampsia during a past pregnancy. °· Having a family history of preeclampsia. °· Having high blood pressure. °· Being pregnant with more than one baby. °· Being 35 or older. °· Being African-American. °· Having kidney disease or diabetes. °· Having medical conditions such as lupus or blood diseases. °· Being very overweight (obese). °What are the signs or symptoms? °The most common symptoms are: °· Severe headaches. °· Vision problems, such as blurred or double vision. °· Abdominal pain, especially upper abdominal pain. °Other symptoms that may develop as the condition gets worse include: °· Sudden weight gain. °· Sudden swelling of the hands, face, legs, and feet. °· Severe nausea  and vomiting. °· Numbness in the face, arms, legs, and feet. °· Dizziness. °· Urinating less than usual. °· Slurred speech. °· Convulsions or seizures. °How is this diagnosed? °There are no screening tests for preeclampsia. Your health care provider will ask you about symptoms and check for signs of preeclampsia during your prenatal visits. You may also have tests that include: °· Checking your blood pressure. °· Urine tests to check for protein. Your health care provider will check for this at every prenatal visit. °· Blood tests. °· Monitoring your baby's heart rate. °· Ultrasound. °How is this treated? °You and your health care provider will determine the treatment approach that is best for you. Treatment may include: °· Having more frequent prenatal exams to check for signs of preeclampsia, if you have an increased risk for preeclampsia. °· Medicine to lower your blood pressure. °· Staying in the hospital, if your condition is severe. There, treatment will focus on controlling your blood pressure and the amount of fluids in your body (fluid retention). °· Taking medicine (magnesium sulfate) to prevent seizures. This may be given as an injection or through an IV. °· Taking a low-dose aspirin during your pregnancy. °· Delivering your baby early. You may have your labor started with medicine (induced), or you may have a cesarean delivery. °Follow these instructions at home: °Eating and drinking ° °· Drink enough fluid to keep your urine pale yellow. °· Avoid caffeine. °Lifestyle °· Do not use any products that contain nicotine or tobacco, such as cigarettes and e-cigarettes. If you need help quitting, ask your health care provider. °· Do not use alcohol or drugs. °· Avoid stress as much as possible. Rest and get   plenty of sleep. °General instructions °· Take over-the-counter and prescription medicines only as told by your health care provider. °· When lying down, lie on your left side. This keeps pressure off your  major blood vessels. °· When sitting or lying down, raise (elevate) your feet. Try putting some pillows underneath your lower legs. °· Exercise regularly. Ask your health care provider what kinds of exercise are best for you. °· Keep all follow-up and prenatal visits as told by your health care provider. This is important. °How is this prevented? °There is no known way of preventing preeclampsia or eclampsia from developing. However, to lower your risk of complications and detect problems early: °· Get regular prenatal care. Your health care provider may be able to diagnose and treat the condition early. °· Maintain a healthy weight. Ask your health care provider for help managing weight gain during pregnancy. °· Work with your health care provider to manage any long-term (chronic) health conditions you have, such as diabetes or kidney problems. °· You may have tests of your blood pressure and kidney function after giving birth. °· Your health care provider may have you take low-dose aspirin during your next pregnancy. °Contact a health care provider if: °· You have symptoms that your health care provider told you may require more treatment or monitoring, such as: °? Headaches. °? Nausea or vomiting. °? Abdominal pain. °? Dizziness. °? Light-headedness. °Get help right away if: °· You have severe: °? Abdominal pain. °? Headaches that do not get better. °? Dizziness. °? Vision problems. °? Confusion. °? Nausea or vomiting. °· You have any of the following: °? A seizure. °? Sudden, rapid weight gain. °? Sudden swelling in your hands, ankles, or face. °? Trouble moving any part of your body. °? Numbness in any part of your body. °? Trouble speaking. °? Abnormal bleeding. °· You faint. °Summary °· Preeclampsia is a serious condition that may develop during pregnancy. °· This condition causes high blood pressure and increased protein in your urine along with other symptoms, such as headaches and vision  changes. °· Diagnosing and treating preeclampsia early is very important. If not treated early, it can cause serious problems for you and your baby. °· Get help right away if you have symptoms that your health care provider told you to watch for. °This information is not intended to replace advice given to you by your health care provider. Make sure you discuss any questions you have with your health care provider. °Document Released: 08/14/2000 Document Revised: 04/19/2018 Document Reviewed: 03/23/2016 °Elsevier Patient Education © 2020 [employer] ° °

## 2019-05-26 NOTE — Procedures (Signed)
Traci Barron 06-Mar-1986 [redacted]w[redacted]d  Fetus A Non-Stress Test Interpretation for 05/26/19  Indication: IUGR  Fetal Heart Rate A Mode: External Baseline Rate (A): 145 bpm Variability: Moderate Accelerations: 15 x 15 Decelerations: None Multiple birth?: No  Uterine Activity Mode: Palpation, Toco Contraction Frequency (min): none Resting Tone Palpated: Relaxed Resting Time: Adequate  Interpretation (Fetal Testing) Nonstress Test Interpretation: Reactive Overall Impression: Reassuring for gestational age Comments: EFM tracing reviewed by Dr. Gertie Exon

## 2019-05-26 NOTE — MAU Note (Signed)
Pt sent from MFM for BP evaluation.  Reports current H/A, denies visual disturbances or epigastric pain.  Denies LOF or VB.  Endorses +FM.

## 2019-05-26 NOTE — MAU Provider Note (Signed)
History     CSN: [id number]  Arrival date and time: 05/26/19 0951   None     Chief Complaint  Patient presents with  . BP Evaluation   HPI Traci Barron is a 33 y.o. G1P0 at [redacted]w[redacted]d who presents to MAU from MFM for evaluation of dull headache and elevated blood pressures in the setting of Gestational Hypertension. She denies visual disturbances, RUQ/epigastric pain, new onset swelling or weight gain. She also denies vaginal bleeding, leaking of fluid, decreased fetal movement, fever, falls, or recent illness.   Headache This is a new problem, onset last night and continuing this morning. She states she worked a full day yesterday staring at a computer then attended a virtual childbirth class last night, so she attributed her headache to eye strain. She rated it as 7/10 last night and 4/10 this morning and upon arrival to MAU. Her pain is anterior and on the left side of her head. Her pain does not radiate. She took 1g Tylenol around 0500. She denies aggravating or alleviating factors. Patient states she usually has a cup of coffee in the mornings but did not today.  OB History    Gravida  1   Para      Term      Preterm      AB      Living        SAB      TAB      Ectopic      Multiple      Live Births             Patient Active Problem List   Diagnosis Date Noted  . Gestational hypertension affecting first pregnancy 05/23/2019  . Pregnancy affected by fetal growth restriction 04/18/2019  . Placenta previa 03/24/2019  . Abnormal fetal ultrasound 03/24/2019  . Obesity in pregnancy 12/08/2018  . AMA (advanced maternal age) multigravida 35+ 12/08/2018  . Supervision of normal pregnancy 12/05/2018  . Mitral valve disease 12/05/2008   Past Medical History:  Diagnosis Date  . MVP (mitral valve prolapse)     Past Surgical History:  Procedure Laterality Date  . WISDOM TOOTH EXTRACTION      Family History  Problem Relation Age of Onset  . Cancer Maternal  Grandfather        great gmother ovarian  . Throat cancer Paternal Grandfather     Social History   Tobacco Use  . Smoking status: Never Smoker  . Smokeless tobacco: Never Used  Substance Use Topics  . Alcohol use: Not Currently  . Drug use: Never    Allergies: No Known Allergies  Medications Prior to Admission  Medication Sig Dispense Refill Last Dose  . acetaminophen (TYLENOL) 500 MG tablet Take 1,000 mg by mouth every 6 (six) hours as needed for headache.   05/26/2019 at 0500  . Prenatal Vit-Fe Fumarate-FA (PRENATAL MULTIVITAMIN) TABS tablet Take 1 tablet by mouth daily at 12 noon.   05/26/2019 at 0715    Review of Systems  Constitutional: Negative for chills, fatigue and fever.  Eyes: Negative for visual disturbance.  Gastrointestinal: Negative for abdominal pain.  Musculoskeletal: Negative for back pain.  Neurological: Positive for headaches. Negative for syncope and weakness.  All other systems reviewed and are negative.  Physical Exam   Last menstrual period 09/27/2018.  Physical Exam  Nursing note and vitals reviewed. Constitutional: She is oriented to person, place, and time. She appears well-developed and well-nourished.  Cardiovascular: Normal rate.  Respiratory: Breath  sounds normal. No respiratory distress.  GI: Soft. There is no abdominal tenderness. There is no CVA tenderness.  Gravid  Neurological: She is alert and oriented to person, place, and time. She has normal strength. No cranial nerve deficit or sensory deficit.  Skin: Skin is warm and dry.  Psychiatric: She has a normal mood and affect. Her behavior is normal. Judgment and thought content normal.    MAU Course/MDM  Procedures  --BPs in MAU in line with patient recent baseline. No severe range --Headache reduced from 4/10 to 1/10 with Tylenol, caffeine and low stim environment --Reactive NST and BPP 8/8 in MFM this morning --Reactive tracing in MAU: baseline 150, moderate variability,  positive accels, no decels --Toco: rare contractions not felt by patient --HPI, treatment and plan of care discussed with Dr. Rip Harbour prior to patient discharge --Discussed with patient MFM guidelines delivery timing with Mild Preeclampsia and reassuring surveillance  Patient Vitals for the past 24 hrs:  BP Temp Temp src Pulse Resp SpO2 Height Weight  05/26/19 1300 - 98.3 F (36.8 C) Oral - 18 99 % - -  05/26/19 1245 140/87 - - 72 - - - -  05/26/19 1230 132/85 - - 74 - - - -  05/26/19 1215 136/82 - - 79 - - - -  05/26/19 1200 136/85 - - 75 - - - -  05/26/19 1145 (!) 134/92 - - 79 - - - -  05/26/19 1130 (!) 135/91 - - 78 - - - -  05/26/19 1115 (!) 137/93 - - 82 - - - -  05/26/19 1100 (!) 139/92 - - 74 - - - -  05/26/19 1045 138/89 - - 79 - - - -  05/26/19 1027 (!) 148/91 - - 85 - - - -  05/26/19 1011 (!) 138/98 98.6 F (37 C) Oral 70 20 100 % 5\' 6"  (1.676 m) 114.5 kg   Results for orders placed or performed during the hospital encounter of 05/26/19 (from the past 24 hour(s))  Protein / creatinine ratio, urine     Status: Abnormal   Collection Time: 05/26/19 10:55 AM  Result Value Ref Range   Creatinine, Urine 16.64 mg/dL   Total Protein, Urine 6 mg/dL   Protein Creatinine Ratio 0.36 (H) 0.00 - 0.15 mg/mg[Cre]  Urinalysis, Routine w reflex microscopic     Status: Abnormal   Collection Time: 05/26/19 10:55 AM  Result Value Ref Range   Color, Urine STRAW (A) YELLOW   APPearance CLEAR CLEAR   Specific Gravity, Urine 1.003 (L) 1.005 - 1.030   pH 8.0 5.0 - 8.0   Glucose, UA NEGATIVE NEGATIVE mg/dL   Hgb urine dipstick NEGATIVE NEGATIVE   Bilirubin Urine NEGATIVE NEGATIVE   Ketones, ur NEGATIVE NEGATIVE mg/dL   Protein, ur NEGATIVE NEGATIVE mg/dL   Nitrite NEGATIVE NEGATIVE   Leukocytes,Ua SMALL (A) NEGATIVE   WBC, UA 0-5 0 - 5 WBC/hpf   Bacteria, UA NONE SEEN NONE SEEN   Squamous Epithelial / LPF 0-5 0 - 5  CBC     Status: Abnormal   Collection Time: 05/26/19 11:36 AM  Result  Value Ref Range   WBC 9.9 4.0 - 10.5 K/uL   RBC 4.03 3.87 - 5.11 MIL/uL   Hemoglobin 13.5 12.0 - 15.0 g/dL   HCT 36.7 36.0 - 46.0 %   MCV 91.1 80.0 - 100.0 fL   MCH 33.5 26.0 - 34.0 pg   MCHC 36.8 (H) 30.0 - 36.0 g/dL   RDW 13.3 11.5 -  15.5 %   Platelets 275 150 - 400 K/uL   nRBC 0.0 0.0 - 0.2 %  Comprehensive metabolic panel     Status: Abnormal   Collection Time: 05/26/19 11:36 AM  Result Value Ref Range   Sodium 137 135 - 145 mmol/L   Potassium 4.0 3.5 - 5.1 mmol/L   Chloride 106 98 - 111 mmol/L   CO2 19 (L) 22 - 32 mmol/L   Glucose, Bld 77 70 - 99 mg/dL   BUN 7 6 - 20 mg/dL   Creatinine, Ser 5.400.58 0.44 - 1.00 mg/dL   Calcium 9.9 8.9 - 98.110.3 mg/dL   Total Protein 5.8 (L) 6.5 - 8.1 g/dL   Albumin 3.0 (L) 3.5 - 5.0 g/dL   AST 18 15 - 41 U/L   ALT 13 0 - 44 U/L   Alkaline Phosphatase 100 38 - 126 U/L   Total Bilirubin 0.2 (L) 0.3 - 1.2 mg/dL   GFR calc non Af Amer >60 >60 mL/min   GFR calc Af Amer >60 >60 mL/min   Anion gap 12 5 - 15   Assessment and Plan  --33 y.o. G1P0 at 752w2d  --Reactive tracing --Preeclampsia without severe features --Discharge home in stable condition with strict PEC precautions  F/U:  --Per  Dr. Alysia PennaErvin, [facility]. Message sent to clinic --BPPs previously scheduled  Calvert CantorSamantha C Tayo Maute, CNM 05/26/2019, 1:25 PM

## 2019-05-30 ENCOUNTER — Ambulatory Visit (HOSPITAL_COMMUNITY): Payer: No Typology Code available for payment source | Admitting: *Deleted

## 2019-05-30 ENCOUNTER — Ambulatory Visit (HOSPITAL_BASED_OUTPATIENT_CLINIC_OR_DEPARTMENT_OTHER)
Admission: RE | Admit: 2019-05-30 | Discharge: 2019-05-30 | Disposition: A | Discharge status: Home / Self Care | Payer: No Typology Code available for payment source | Source: Ambulatory Visit | Attending: Maternal & Fetal Medicine | Admitting: Maternal & Fetal Medicine

## 2019-05-30 ENCOUNTER — Other Ambulatory Visit: Payer: Self-pay

## 2019-05-30 ENCOUNTER — Ambulatory Visit (HOSPITAL_COMMUNITY): Payer: No Typology Code available for payment source

## 2019-05-30 ENCOUNTER — Telehealth: Payer: Self-pay | Admitting: *Deleted

## 2019-05-30 ENCOUNTER — Encounter (HOSPITAL_COMMUNITY): Payer: Self-pay

## 2019-05-30 VITALS — BP 133/101 | HR 78 | Temp 99.1°F

## 2019-05-30 DIAGNOSIS — O3413 Maternal care for benign tumor of corpus uteri, third trimester: Secondary | ICD-10-CM

## 2019-05-30 DIAGNOSIS — O99413 Diseases of the circulatory system complicating pregnancy, third trimester: Secondary | ICD-10-CM

## 2019-05-30 DIAGNOSIS — O99210 Obesity complicating pregnancy, unspecified trimester: Secondary | ICD-10-CM

## 2019-05-30 DIAGNOSIS — O289 Unspecified abnormal findings on antenatal screening of mother: Secondary | ICD-10-CM

## 2019-05-30 DIAGNOSIS — Z3493 Encounter for supervision of normal pregnancy, unspecified, third trimester: Secondary | ICD-10-CM

## 2019-05-30 DIAGNOSIS — O09523 Supervision of elderly multigravida, third trimester: Secondary | ICD-10-CM | POA: Diagnosis not present

## 2019-05-30 DIAGNOSIS — O365930 Maternal care for other known or suspected poor fetal growth, third trimester, not applicable or unspecified: Secondary | ICD-10-CM | POA: Insufficient documentation

## 2019-05-30 DIAGNOSIS — Z3A33 33 weeks gestation of pregnancy: Secondary | ICD-10-CM

## 2019-05-30 DIAGNOSIS — O283 Abnormal ultrasonic finding on antenatal screening of mother: Secondary | ICD-10-CM

## 2019-05-30 DIAGNOSIS — O4443 Low lying placenta NOS or without hemorrhage, third trimester: Secondary | ICD-10-CM

## 2019-05-30 MED ORDER — BETAMETHASONE SOD PHOS & ACET 6 (3-3) MG/ML IJ SUSP
12.0000 mg | Freq: Once | INTRAMUSCULAR | Status: AC
  Administered 2019-05-30: 12 mg via INTRAMUSCULAR

## 2019-05-30 NOTE — Procedures (Signed)
Traci Barron 09-05-1985 [redacted]w[redacted]d  Fetus A Non-Stress Test Interpretation for 05/30/19  Indication: IUGR  Fetal Heart Rate A Mode: External Baseline Rate (A): 140 bpm Variability: Moderate Accelerations: 15 x 15 Decelerations: None Multiple birth?: No  Uterine Activity Mode: Palpation, Toco Contraction Frequency (min): Occas Contraction Quality: Mild Resting Tone Palpated: Relaxed Resting Time: Adequate  Interpretation (Fetal Testing) Nonstress Test Interpretation: Reactive Comments: EFM tracing reviewed by Dr. Annamaria Boots

## 2019-05-30 NOTE — Telephone Encounter (Signed)
Left patient a message to call and schedule appointment for 05/31/19

## 2019-05-30 NOTE — Progress Notes (Signed)
Left message at [facility] office regarding patient's BP, IUGR. Patient will call office to schedule a time in AM to receive 2nd dose of BMZ and have BP check. Dr. Annamaria Boots spoke with Dr. Nehemiah Settle regarding current status and plan for delivery.

## 2019-05-31 ENCOUNTER — Other Ambulatory Visit: Payer: Self-pay

## 2019-05-31 ENCOUNTER — Ambulatory Visit (INDEPENDENT_AMBULATORY_CARE_PROVIDER_SITE_OTHER): Payer: PRIVATE HEALTH INSURANCE | Admitting: Obstetrics and Gynecology

## 2019-05-31 VITALS — BP 143/93 | HR 84

## 2019-05-31 DIAGNOSIS — O0993 Supervision of high risk pregnancy, unspecified, third trimester: Secondary | ICD-10-CM

## 2019-05-31 DIAGNOSIS — O0990 Supervision of high risk pregnancy, unspecified, unspecified trimester: Secondary | ICD-10-CM

## 2019-05-31 DIAGNOSIS — O4443 Low lying placenta NOS or without hemorrhage, third trimester: Secondary | ICD-10-CM | POA: Diagnosis not present

## 2019-05-31 DIAGNOSIS — Z3A34 34 weeks gestation of pregnancy: Secondary | ICD-10-CM

## 2019-05-31 DIAGNOSIS — O1403 Mild to moderate pre-eclampsia, third trimester: Secondary | ICD-10-CM | POA: Diagnosis not present

## 2019-05-31 DIAGNOSIS — O4440 Low lying placenta NOS or without hemorrhage, unspecified trimester: Secondary | ICD-10-CM

## 2019-05-31 DIAGNOSIS — O365990 Maternal care for other known or suspected poor fetal growth, unspecified trimester, not applicable or unspecified: Secondary | ICD-10-CM | POA: Diagnosis not present

## 2019-05-31 DIAGNOSIS — O1400 Mild to moderate pre-eclampsia, unspecified trimester: Secondary | ICD-10-CM

## 2019-05-31 MED ORDER — BETAMETHASONE SOD PHOS & ACET 6 (3-3) MG/ML IJ SUSP
12.0000 mg | Freq: Once | INTRAMUSCULAR | Status: AC
  Administered 2019-05-31: 12 mg via INTRAMUSCULAR

## 2019-05-31 NOTE — Progress Notes (Signed)
   PRENATAL VISIT NOTE  Subjective:  Traci Barron is a 33 y.o. G1P0 at [redacted]w[redacted]d being seen today for ongoing prenatal care.  She is currently monitored for the following issues for this high-risk pregnancy and has Mitral valve disease; Supervision of high risk pregnancy, antepartum; Obesity in pregnancy; AMA (advanced maternal age) multigravida 63+; Abnormal fetal ultrasound; Pregnancy affected by fetal growth restriction; Gestational hypertension affecting first pregnancy; Antepartum mild preeclampsia; and Low-lying placenta on their problem list.  Patient reports no complaints.  Contractions: Not present. Vag. Bleeding: None.  Movement: Present. Denies leaking of fluid.   The following portions of the patient's history were reviewed and updated as appropriate: allergies, current medications, past family history, past medical history, past social history, past surgical history and problem list.   Objective:   Vitals:   05/31/19 0837 05/31/19 0847  BP: (!) 144/89 (!) 143/93  Pulse: 82 84    Fetal Status:     Movement: Present     General:  Alert, oriented and cooperative. Patient is in no acute distress.  Skin: Skin is warm and dry. No rash noted.   Cardiovascular: Normal heart rate noted  Respiratory: Normal respiratory effort, no problems with respiration noted  Abdomen: Soft, gravid, appropriate for gestational age.  Pain/Pressure: Absent     Pelvic: Cervical exam deferred        Extremities: Normal range of motion.  Edema: Trace  Mental Status: Normal mood and affect. Normal behavior. Normal judgment and thought content.   Assessment and Plan:  Pregnancy: G1P0 at [redacted]w[redacted]d  1. Antepartum mild preeclampsia  Asymptomatic BP not in severe range  Betamethasone given today in the office She should keep her appointment tomorrow with MD to discuss MFM's plan for delivery. 34 weeks vs 35 weeks.   2. Low-lying placenta  C-section Vs. Vaginal delivery   3. Pregnancy affected by fetal  growth restriction  Per MFM: Due to preeclampsia in the setting of an IUGR fetus along  with her elevated blood pressures, I would recommend that  she be delivered at between 34 to 35 weeks.  We will plan on  administering a rescue course of steroids starting today.  Due  to placenta previa, the patient understands that a cesarean  delivery will be scheduled for her.  Preeclampsia precautions were discussed.  She should  continue fetal testing until delivery.  She attests to + fetal movement.   Preterm labor symptoms and general obstetric precautions including but not limited to vaginal bleeding, contractions, leaking of fluid and fetal movement were reviewed in detail with the patient. Please refer to After Visit Summary for other counseling recommendations.   No follow-ups on file.  Future Appointments  Date Time Provider [place]  06/01/2019  2:15 PM Lavonia Drafts, MD [facility]  06/02/2019  7:40 AM [facility]-M[facility] NURSE WH-MFC [facility]  06/02/2019  7:45 AM WH-MFC [place] 1 WH-MFCUS MFC-US  06/02/2019  8:45 AM WH-MFC NST [place] [facility]-US  06/06/2019  8:00 AM [place] [facility]  06/06/2019  8:00 AM WH-MFC [place] 3 WH-MFCUS MFC-US  06/06/2019  8:45 AM WH-MFC NST [place] [facility]-US  06/08/2019  1:45 PM Emily Filbert, MD [facility]  06/09/2019  7:40 AM WH-MFC NURSE WH-MFC [facility]-US  06/09/2019  7:45 AM [place] 1 WH-MFCUS MFC-US  06/09/2019  8:45 AM WH-MFC NST [place] MFC-US    Noni Saupe, NP

## 2019-06-01 ENCOUNTER — Other Ambulatory Visit: Payer: Self-pay

## 2019-06-01 ENCOUNTER — Other Ambulatory Visit (HOSPITAL_COMMUNITY)
Admission: RE | Admit: 2019-06-01 | Discharge: 2019-06-01 | Disposition: A | Discharge status: Home / Self Care | Payer: No Typology Code available for payment source | Source: Ambulatory Visit | Attending: Obstetrics & Gynecology | Admitting: Obstetrics & Gynecology

## 2019-06-01 ENCOUNTER — Ambulatory Visit (INDEPENDENT_AMBULATORY_CARE_PROVIDER_SITE_OTHER): Payer: PRIVATE HEALTH INSURANCE | Admitting: Obstetrics & Gynecology

## 2019-06-01 VITALS — BP 131/81 | HR 65 | Wt 252.0 lb

## 2019-06-01 DIAGNOSIS — Z3A34 34 weeks gestation of pregnancy: Secondary | ICD-10-CM

## 2019-06-01 DIAGNOSIS — O365990 Maternal care for other known or suspected poor fetal growth, unspecified trimester, not applicable or unspecified: Secondary | ICD-10-CM

## 2019-06-01 DIAGNOSIS — O365993 Maternal care for other known or suspected poor fetal growth, unspecified trimester, fetus 3: Secondary | ICD-10-CM

## 2019-06-01 DIAGNOSIS — O99210 Obesity complicating pregnancy, unspecified trimester: Secondary | ICD-10-CM

## 2019-06-01 DIAGNOSIS — O99213 Obesity complicating pregnancy, third trimester: Secondary | ICD-10-CM

## 2019-06-01 DIAGNOSIS — O09529 Supervision of elderly multigravida, unspecified trimester: Secondary | ICD-10-CM

## 2019-06-01 DIAGNOSIS — I059 Rheumatic mitral valve disease, unspecified: Secondary | ICD-10-CM

## 2019-06-01 DIAGNOSIS — O139 Gestational [pregnancy-induced] hypertension without significant proteinuria, unspecified trimester: Secondary | ICD-10-CM

## 2019-06-01 DIAGNOSIS — O4403 Placenta previa specified as without hemorrhage, third trimester: Secondary | ICD-10-CM

## 2019-06-01 DIAGNOSIS — O0990 Supervision of high risk pregnancy, unspecified, unspecified trimester: Secondary | ICD-10-CM

## 2019-06-01 DIAGNOSIS — O4400 Placenta previa specified as without hemorrhage, unspecified trimester: Secondary | ICD-10-CM

## 2019-06-01 DIAGNOSIS — O09523 Supervision of elderly multigravida, third trimester: Secondary | ICD-10-CM

## 2019-06-01 DIAGNOSIS — O0993 Supervision of high risk pregnancy, unspecified, third trimester: Secondary | ICD-10-CM

## 2019-06-01 DIAGNOSIS — O133 Gestational [pregnancy-induced] hypertension without significant proteinuria, third trimester: Secondary | ICD-10-CM

## 2019-06-01 LAB — CBC
HCT: 35 % — ABNORMAL LOW (ref 36.0–46.0)
Hemoglobin: 12.3 g/dL (ref 12.0–15.0)
MCH: 32.8 pg (ref 26.0–34.0)
MCHC: 35.1 g/dL (ref 30.0–36.0)
MCV: 93.3 fL (ref 80.0–100.0)
Platelets: 274 10*3/uL (ref 150–400)
RBC: 3.75 MIL/uL — ABNORMAL LOW (ref 3.87–5.11)
RDW: 13.3 % (ref 11.5–15.5)
WBC: 14.3 10*3/uL — ABNORMAL HIGH (ref 4.0–10.5)
nRBC: 0 % (ref 0.0–0.2)

## 2019-06-01 LAB — SARS CORONAVIRUS 2 (TAT 6-24 HRS): SARS Coronavirus 2: NEGATIVE

## 2019-06-01 NOTE — Anesthesia Preprocedure Evaluation (Addendum)
Anesthesia Evaluation  Patient identified by MRN, date of birth, ID band Patient awake    Reviewed: Allergy & Precautions, NPO status , Patient's Chart, lab work & pertinent test results  Airway Mallampati: II  TM Distance: >3 FB Neck ROM: Full    Dental no notable dental hx.    Pulmonary shortness of breath,    Pulmonary exam normal breath sounds clear to auscultation       Cardiovascular hypertension, Normal cardiovascular exam Rhythm:Regular Rate:Normal  Hx of MVP but echo 05/01/19 normal   1. The left ventricle has normal systolic function with an ejection fraction of 60-65%. The cavity size was normal. Left ventricular diastolic parameters were normal.  2. The right ventricle has normal systolic function. The cavity was normal. There is no increase in right ventricular wall thickness.  3. The mitral valve is grossly normal. Mild thickening of the mitral valve leaflet.  4. Flat closure with no frank prolapse.  5. The aorta is normal unless otherwise noted.   Neuro/Psych negative neurological ROS  negative psych ROS   GI/Hepatic negative GI ROS, Neg liver ROS,   Endo/Other  Morbid obesityBMI 41  Renal/GU negative Renal ROS  negative genitourinary   Musculoskeletal negative musculoskeletal ROS (+)   Abdominal   Peds  Hematology negative hematology ROS (+)   Anesthesia Other Findings Placenta previa, IUGR AMA preE  Reproductive/Obstetrics (+) Pregnancy                             Anesthesia Physical Anesthesia Plan  ASA: III  Anesthesia Plan: Spinal   Post-op Pain Management:    Induction:   PONV Risk Score and Plan: 3 and Ondansetron, Dexamethasone and Treatment may vary due to age or medical condition  Airway Management Planned: Natural Airway and Nasal Cannula  Additional Equipment: None  Intra-op Plan:   Post-operative Plan:   Informed Consent: I have reviewed the  patients History and Physical, chart, labs and discussed the procedure including the risks, benefits and alternatives for the proposed anesthesia with the patient or authorized representative who has indicated his/her understanding and acceptance.       Plan Discussed with: CRNA  Anesthesia Plan Comments: (2 units prbc available, covid negative, CBC WNL)       Anesthesia Quick Evaluation

## 2019-06-01 NOTE — Patient Instructions (Signed)
Ciin Kneece  06/01/2019   Your procedure is scheduled on:  06/02/2019  Arrive at 1030 at Entrance C on [address] at [facility]. You are invited to use the FREE valet parking or use the Visitor's parking deck.  Pick up the phone at the desk and dial [phone].  Call this number if you have problems the morning of surgery: [phone]  Remember:   Do not eat food:(After Midnight) Desps de medianoche.  Do not drink clear liquids: (After Midnight) Desps de medianoche.  Take these medicines the morning of surgery with A SIP OF WATER:  none   Do not wear jewelry, make-up or nail polish.  Do not wear lotions, powders, or perfumes. Do not wear deodorant.  Do not shave 48 hours prior to surgery.  Do not bring valuables to the hospital.  [place] is not   responsible for any belongings or valuables brought to the hospital.  Contacts, dentures or bridgework may not be worn into surgery.  Leave suitcase in the car. After surgery it may be brought to your room.  For patients admitted to the hospital, checkout time is 11:00 AM the day of              discharge.      Please read over the following fact sheets that you were given:     Preparing for Surgery   

## 2019-06-01 NOTE — Patient Instructions (Signed)
Traci Barron  06/01/2019   Your procedure is scheduled on:  06/02/2019  Arrive at 81 at Entrance C on [address] at [facility]  and [employer]. You are invited to use the FREE valet parking or use the Visitor's parking deck.  Pick up the phone at the desk and dial [phone].  Call this number if you have problems the morning of surgery: [phone]  Remember:   Do not eat food:(After Midnight) Desps de medianoche.  Do not drink clear liquids: (After Midnight) Desps de medianoche.  Take these medicines the morning of surgery with A SIP OF WATER:  none   Do not wear jewelry, make-up or nail polish.  Do not wear lotions, powders, or perfumes. Do not wear deodorant.  Do not shave 48 hours prior to surgery.  Do not bring valuables to the hospital.  [facility] is not   responsible for any belongings or valuables brought to the hospital.  Contacts, dentures or bridgework may not be worn into surgery.  Leave suitcase in the car. After surgery it may be brought to your room.  For patients admitted to the hospital, checkout time is 11:00 AM the day of              discharge.      Please read over the following fact sheets that you were given:     Preparing for Surgery

## 2019-06-01 NOTE — Progress Notes (Signed)
   PRENATAL VISIT NOTE  Subjective:  Traci Barron is a 33 y.o. G1P0 at [redacted]w[redacted]d being seen today for ongoing prenatal care.  She is currently monitored for the following issues for this high-risk pregnancy and has Mitral valve disease; Supervision of high risk pregnancy, antepartum; Obesity in pregnancy; AMA (advanced maternal age) multigravida 65+; Abnormal fetal ultrasound; Pregnancy affected by fetal growth restriction; Gestational hypertension affecting first pregnancy; Antepartum mild preeclampsia; and Low-lying placenta on their problem list.  Patient reports no complaints.  Contractions: Not present. Vag. Bleeding: None.  Movement: Present. Denies leaking of fluid.   The following portions of the patient's history were reviewed and updated as appropriate: allergies, current medications, past family history, past medical history, past social history, past surgical history and problem list.   Objective:   Vitals:   06/01/19 1412  BP: 131/81  Pulse: 65  Weight: 252 lb (114.3 kg)    Fetal Status: Fetal Heart Rate (bpm): 132   Movement: Present     General:  Alert, oriented and cooperative. Patient is in no acute distress.  Skin: Skin is warm and dry. No rash noted.   Cardiovascular: Normal heart rate noted  Respiratory: Normal respiratory effort, no problems with respiration noted  Abdomen: Soft, gravid, appropriate for gestational age.  Pain/Pressure: Absent     Pelvic: Cervical exam deferred        Extremities: Normal range of motion.  Edema: Trace  Mental Status: Normal mood and affect. Normal behavior. Normal judgment and thought content.   Assessment and Plan:  Pregnancy: G1P0 at [redacted]w[redacted]d Traci Barron was seen today for routine prenatal visit.  Diagnoses and all orders for this visit:  Supervision of high risk pregnancy, antepartum  Pregnancy affected by fetal growth restriction  Placenta previa antepartum  Obesity in pregnancy  Mitral valve disease  Gestational  hypertension affecting first pregnancy  Antepartum multigravida of advanced maternal age  Rec primary c-section tomorrow. I spoke to Dr, Nehemiah Settle and explained why I did not believe pt should wait until 35 weeks. He concurs and has spoken to Dr. Donalee Citrin. I have reviewed with pt the preop instructions. All of her questions were answered.      Preterm labor symptoms and general obstetric precautions including but not limited to vaginal bleeding, contractions, leaking of fluid and fetal movement were reviewed in detail with the patient. Please refer to After Visit Summary for other counseling recommendations.   No follow-ups on file.  Lavonia Drafts, MD

## 2019-06-01 NOTE — MAU Note (Signed)
Asymptomatic, swab collected. Lab called for blood work. 

## 2019-06-02 ENCOUNTER — Inpatient Hospital Stay (HOSPITAL_COMMUNITY)
Admission: AD | Admit: 2019-06-02 | Discharge: 2019-06-05 | DRG: 786 | Disposition: A | Discharge status: Home / Self Care | Payer: No Typology Code available for payment source | Attending: Obstetrics and Gynecology | Admitting: Obstetrics and Gynecology

## 2019-06-02 ENCOUNTER — Ambulatory Visit (HOSPITAL_COMMUNITY)
Admission: RE | Admit: 2019-06-02 | Discharge: 2019-06-02 | Disposition: A | Discharge status: Home / Self Care | Payer: No Typology Code available for payment source | Source: Ambulatory Visit | Attending: Maternal & Fetal Medicine | Admitting: Maternal & Fetal Medicine

## 2019-06-02 ENCOUNTER — Encounter (HOSPITAL_COMMUNITY)
Admission: AD | Disposition: A | Discharge status: Home / Self Care | Payer: Self-pay | Source: Home / Self Care | Attending: Obstetrics & Gynecology

## 2019-06-02 ENCOUNTER — Ambulatory Visit (HOSPITAL_COMMUNITY): Payer: No Typology Code available for payment source

## 2019-06-02 ENCOUNTER — Encounter (HOSPITAL_COMMUNITY): Payer: Self-pay | Admitting: General Practice

## 2019-06-02 ENCOUNTER — Inpatient Hospital Stay (HOSPITAL_COMMUNITY): Payer: No Typology Code available for payment source | Admitting: Anesthesiology

## 2019-06-02 ENCOUNTER — Other Ambulatory Visit: Payer: Self-pay

## 2019-06-02 DIAGNOSIS — Z3A34 34 weeks gestation of pregnancy: Secondary | ICD-10-CM | POA: Diagnosis not present

## 2019-06-02 DIAGNOSIS — O4403 Placenta previa specified as without hemorrhage, third trimester: Principal | ICD-10-CM | POA: Diagnosis present

## 2019-06-02 DIAGNOSIS — Z98891 History of uterine scar from previous surgery: Secondary | ICD-10-CM

## 2019-06-02 DIAGNOSIS — I341 Nonrheumatic mitral (valve) prolapse: Secondary | ICD-10-CM | POA: Diagnosis present

## 2019-06-02 DIAGNOSIS — Z9889 Other specified postprocedural states: Secondary | ICD-10-CM

## 2019-06-02 DIAGNOSIS — O4400 Placenta previa specified as without hemorrhage, unspecified trimester: Secondary | ICD-10-CM | POA: Diagnosis present

## 2019-06-02 DIAGNOSIS — O365930 Maternal care for other known or suspected poor fetal growth, third trimester, not applicable or unspecified: Secondary | ICD-10-CM | POA: Diagnosis present

## 2019-06-02 DIAGNOSIS — O1494 Unspecified pre-eclampsia, complicating childbirth: Secondary | ICD-10-CM | POA: Diagnosis not present

## 2019-06-02 DIAGNOSIS — O283 Abnormal ultrasonic finding on antenatal screening of mother: Secondary | ICD-10-CM

## 2019-06-02 DIAGNOSIS — O1404 Mild to moderate pre-eclampsia, complicating childbirth: Secondary | ICD-10-CM | POA: Diagnosis present

## 2019-06-02 DIAGNOSIS — O9942 Diseases of the circulatory system complicating childbirth: Secondary | ICD-10-CM | POA: Diagnosis present

## 2019-06-02 DIAGNOSIS — Z20828 Contact with and (suspected) exposure to other viral communicable diseases: Secondary | ICD-10-CM | POA: Diagnosis present

## 2019-06-02 DIAGNOSIS — O99210 Obesity complicating pregnancy, unspecified trimester: Secondary | ICD-10-CM

## 2019-06-02 DIAGNOSIS — O4443 Low lying placenta NOS or without hemorrhage, third trimester: Secondary | ICD-10-CM | POA: Diagnosis present

## 2019-06-02 DIAGNOSIS — O0990 Supervision of high risk pregnancy, unspecified, unspecified trimester: Secondary | ICD-10-CM

## 2019-06-02 DIAGNOSIS — O99214 Obesity complicating childbirth: Secondary | ICD-10-CM | POA: Diagnosis present

## 2019-06-02 LAB — RPR: RPR Ser Ql: NONREACTIVE

## 2019-06-02 SURGERY — Surgical Case
Anesthesia: Spinal | Wound class: Clean Contaminated

## 2019-06-02 MED ORDER — BUPIVACAINE IN DEXTROSE 0.75-8.25 % IT SOLN
INTRATHECAL | Status: DC | PRN
  Administered 2019-06-02: 1.7 mL via INTRATHECAL

## 2019-06-02 MED ORDER — ACETAMINOPHEN 500 MG PO TABS
1000.0000 mg | ORAL_TABLET | Freq: Four times a day (QID) | ORAL | Status: AC
  Administered 2019-06-03 (×2): 1000 mg via ORAL
  Filled 2019-06-02 (×2): qty 2

## 2019-06-02 MED ORDER — MENTHOL 3 MG MT LOZG: 1.0000 | LOZENGE | OROMUCOSAL | Status: DC | PRN

## 2019-06-02 MED ORDER — TETANUS-DIPHTH-ACELL PERTUSSIS 5-2.5-18.5 LF-MCG/0.5 IM SUSP: 0.5000 mL | Freq: Once | INTRAMUSCULAR | Status: DC

## 2019-06-02 MED ORDER — SIMETHICONE 80 MG PO CHEW
80.0000 mg | CHEWABLE_TABLET | ORAL | Status: DC
  Administered 2019-06-03 – 2019-06-04 (×2): 80 mg via ORAL
  Filled 2019-06-02 (×2): qty 1

## 2019-06-02 MED ORDER — SODIUM CHLORIDE 0.9 % IV SOLN
INTRAVENOUS | Status: DC | PRN
  Administered 2019-06-02: 40 [IU] via INTRAVENOUS

## 2019-06-02 MED ORDER — LEVONORGESTREL 19.5 MCG/DAY IU IUD: INTRAUTERINE_SYSTEM | Freq: Once | INTRAUTERINE | Status: DC

## 2019-06-02 MED ORDER — CEFAZOLIN SODIUM-DEXTROSE 2-4 GM/100ML-% IV SOLN
INTRAVENOUS | Status: AC
  Filled 2019-06-02: qty 100

## 2019-06-02 MED ORDER — DIPHENHYDRAMINE HCL 25 MG PO CAPS: 25.0000 mg | ORAL_CAPSULE | ORAL | Status: DC | PRN

## 2019-06-02 MED ORDER — SCOPOLAMINE 1 MG/3DAYS TD PT72
1.0000 | MEDICATED_PATCH | Freq: Once | TRANSDERMAL | Status: DC
  Administered 2019-06-02: 14:00:00 1.5 mg via TRANSDERMAL

## 2019-06-02 MED ORDER — IBUPROFEN 800 MG PO TABS
800.0000 mg | ORAL_TABLET | Freq: Three times a day (TID) | ORAL | Status: DC
  Administered 2019-06-02 – 2019-06-05 (×8): 800 mg via ORAL
  Filled 2019-06-02 (×8): qty 1

## 2019-06-02 MED ORDER — OXYCODONE HCL 5 MG PO TABS: 5.0000 mg | ORAL_TABLET | Freq: Once | ORAL | Status: DC | PRN

## 2019-06-02 MED ORDER — LIDOCAINE-EPINEPHRINE (PF) 2 %-1:200000 IJ SOLN
INTRAMUSCULAR | Status: AC
  Filled 2019-06-02: qty 10

## 2019-06-02 MED ORDER — KETOROLAC TROMETHAMINE 30 MG/ML IJ SOLN: 30.0000 mg | Freq: Four times a day (QID) | INTRAMUSCULAR | Status: AC | PRN

## 2019-06-02 MED ORDER — SENNOSIDES-DOCUSATE SODIUM 8.6-50 MG PO TABS
2.0000 | ORAL_TABLET | ORAL | Status: DC
  Administered 2019-06-03 – 2019-06-04 (×2): 2 via ORAL
  Filled 2019-06-02 (×2): qty 2

## 2019-06-02 MED ORDER — PROMETHAZINE HCL 25 MG/ML IJ SOLN: 6.2500 mg | INTRAMUSCULAR | Status: DC | PRN

## 2019-06-02 MED ORDER — NALBUPHINE HCL 10 MG/ML IJ SOLN
5.0000 mg | INTRAMUSCULAR | Status: DC | PRN
  Filled 2019-06-02: qty 0.5

## 2019-06-02 MED ORDER — FENTANYL CITRATE (PF) 100 MCG/2ML IJ SOLN
INTRAMUSCULAR | Status: DC | PRN
  Administered 2019-06-02: 15 ug via INTRATHECAL

## 2019-06-02 MED ORDER — HYDROMORPHONE HCL 1 MG/ML IJ SOLN: 0.2500 mg | INTRAMUSCULAR | Status: DC | PRN

## 2019-06-02 MED ORDER — ONDANSETRON HCL 4 MG/2ML IJ SOLN
INTRAMUSCULAR | Status: AC
  Filled 2019-06-02: qty 2

## 2019-06-02 MED ORDER — BUPIVACAINE IN DEXTROSE 0.75-8.25 % IT SOLN: INTRATHECAL | Status: DC | PRN

## 2019-06-02 MED ORDER — KETOROLAC TROMETHAMINE 30 MG/ML IJ SOLN
INTRAMUSCULAR | Status: AC
  Filled 2019-06-02: qty 1

## 2019-06-02 MED ORDER — ZOLPIDEM TARTRATE 5 MG PO TABS: 5.0000 mg | ORAL_TABLET | Freq: Every evening | ORAL | Status: DC | PRN

## 2019-06-02 MED ORDER — DIPHENHYDRAMINE HCL 50 MG/ML IJ SOLN: 12.5000 mg | INTRAMUSCULAR | Status: DC | PRN

## 2019-06-02 MED ORDER — ONDANSETRON HCL 4 MG/2ML IJ SOLN: 4.0000 mg | Freq: Three times a day (TID) | INTRAMUSCULAR | Status: DC | PRN

## 2019-06-02 MED ORDER — BUPIVACAINE HCL (PF) 0.5 % IJ SOLN
INTRAMUSCULAR | Status: DC | PRN
  Administered 2019-06-02: 30 mL

## 2019-06-02 MED ORDER — SCOPOLAMINE 1 MG/3DAYS TD PT72
MEDICATED_PATCH | TRANSDERMAL | Status: AC
  Filled 2019-06-02: qty 1

## 2019-06-02 MED ORDER — PHENYLEPHRINE HCL-NACL 20-0.9 MG/250ML-% IV SOLN
INTRAVENOUS | Status: DC | PRN
  Administered 2019-06-02: 60 ug/min via INTRAVENOUS

## 2019-06-02 MED ORDER — LACTATED RINGERS IV SOLN
125.0000 mL/h | INTRAVENOUS | Status: DC
  Administered 2019-06-02 (×2): 125 mL/h via INTRAVENOUS

## 2019-06-02 MED ORDER — PRENATAL MULTIVITAMIN CH
1.0000 | ORAL_TABLET | Freq: Every day | ORAL | Status: DC
  Administered 2019-06-03 – 2019-06-04 (×2): 1 via ORAL
  Filled 2019-06-02 (×2): qty 1

## 2019-06-02 MED ORDER — OXYCODONE HCL 5 MG/5ML PO SOLN: 5.0000 mg | Freq: Once | ORAL | Status: DC | PRN

## 2019-06-02 MED ORDER — CEFAZOLIN SODIUM-DEXTROSE 2-4 GM/100ML-% IV SOLN
2.0000 g | INTRAVENOUS | Status: AC
  Administered 2019-06-02: 2 g via INTRAVENOUS

## 2019-06-02 MED ORDER — WITCH HAZEL-GLYCERIN EX PADS: 1.0000 "application " | MEDICATED_PAD | CUTANEOUS | Status: DC | PRN

## 2019-06-02 MED ORDER — SIMETHICONE 80 MG PO CHEW
80.0000 mg | CHEWABLE_TABLET | Freq: Three times a day (TID) | ORAL | Status: DC
  Administered 2019-06-03 – 2019-06-04 (×6): 80 mg via ORAL
  Filled 2019-06-02 (×6): qty 1

## 2019-06-02 MED ORDER — LIDOCAINE 2% (20 MG/ML) 5 ML SYRINGE
INTRAMUSCULAR | Status: AC
  Filled 2019-06-02: qty 5

## 2019-06-02 MED ORDER — STERILE WATER FOR IRRIGATION IR SOLN
Status: DC | PRN
  Administered 2019-06-02: 1000 mL

## 2019-06-02 MED ORDER — SODIUM CHLORIDE 0.9 % IV SOLN
INTRAVENOUS | Status: DC | PRN
  Administered 2019-06-02: 13:00:00 via INTRAVENOUS

## 2019-06-02 MED ORDER — DIPHENHYDRAMINE HCL 25 MG PO CAPS: 25.0000 mg | ORAL_CAPSULE | Freq: Four times a day (QID) | ORAL | Status: DC | PRN

## 2019-06-02 MED ORDER — NALOXONE HCL 4 MG/10ML IJ SOLN
1.0000 ug/kg/h | INTRAVENOUS | Status: DC | PRN
  Filled 2019-06-02: qty 5

## 2019-06-02 MED ORDER — MORPHINE SULFATE (PF) 0.5 MG/ML IJ SOLN
INTRAMUSCULAR | Status: AC
  Filled 2019-06-02: qty 10

## 2019-06-02 MED ORDER — CEFAZOLIN SODIUM-DEXTROSE 2-4 GM/100ML-% IV SOLN: 2.0000 g | INTRAVENOUS | Status: DC

## 2019-06-02 MED ORDER — MEPERIDINE HCL 25 MG/ML IJ SOLN: 6.2500 mg | INTRAMUSCULAR | Status: DC | PRN

## 2019-06-02 MED ORDER — KETOROLAC TROMETHAMINE 30 MG/ML IJ SOLN
30.0000 mg | Freq: Once | INTRAMUSCULAR | Status: AC | PRN
  Administered 2019-06-02: 30 mg via INTRAVENOUS

## 2019-06-02 MED ORDER — LACTATED RINGERS IV SOLN
INTRAVENOUS | Status: DC
  Administered 2019-06-03: via INTRAVENOUS

## 2019-06-02 MED ORDER — OXYCODONE-ACETAMINOPHEN 5-325 MG PO TABS: 1.0000 | ORAL_TABLET | ORAL | Status: DC | PRN

## 2019-06-02 MED ORDER — PHENYLEPHRINE HCL-NACL 20-0.9 MG/250ML-% IV SOLN
INTRAVENOUS | Status: AC
  Filled 2019-06-02: qty 250

## 2019-06-02 MED ORDER — DIBUCAINE (PERIANAL) 1 % EX OINT: 1.0000 "application " | TOPICAL_OINTMENT | CUTANEOUS | Status: DC | PRN

## 2019-06-02 MED ORDER — SODIUM CHLORIDE 0.9% FLUSH: 3.0000 mL | INTRAVENOUS | Status: DC | PRN

## 2019-06-02 MED ORDER — MORPHINE SULFATE (PF) 0.5 MG/ML IJ SOLN
INTRAMUSCULAR | Status: DC | PRN
  Administered 2019-06-02: .15 mg via INTRATHECAL

## 2019-06-02 MED ORDER — BUPIVACAINE HCL (PF) 0.5 % IJ SOLN
INTRAMUSCULAR | Status: AC
  Filled 2019-06-02: qty 30

## 2019-06-02 MED ORDER — FENTANYL CITRATE (PF) 100 MCG/2ML IJ SOLN
INTRAMUSCULAR | Status: AC
  Filled 2019-06-02: qty 2

## 2019-06-02 MED ORDER — SODIUM CHLORIDE 0.9 % IR SOLN
Status: DC | PRN
  Administered 2019-06-02: 1000 mL

## 2019-06-02 MED ORDER — NALBUPHINE HCL 10 MG/ML IJ SOLN
5.0000 mg | Freq: Once | INTRAMUSCULAR | Status: DC | PRN
  Filled 2019-06-02: qty 0.5

## 2019-06-02 MED ORDER — NALOXONE HCL 0.4 MG/ML IJ SOLN: 0.4000 mg | INTRAMUSCULAR | Status: DC | PRN

## 2019-06-02 MED ORDER — SIMETHICONE 80 MG PO CHEW: 80.0000 mg | CHEWABLE_TABLET | ORAL | Status: DC | PRN

## 2019-06-02 MED ORDER — COCONUT OIL OIL
1.0000 "application " | TOPICAL_OIL | Status: DC | PRN
  Administered 2019-06-03: 1 via TOPICAL

## 2019-06-02 MED ORDER — OXYTOCIN 40 UNITS IN NORMAL SALINE INFUSION - SIMPLE MED
INTRAVENOUS | Status: AC
  Filled 2019-06-02: qty 1000

## 2019-06-02 MED ORDER — ONDANSETRON HCL 4 MG/2ML IJ SOLN
INTRAMUSCULAR | Status: DC | PRN
  Administered 2019-06-02: 4 mg via INTRAVENOUS

## 2019-06-02 MED ORDER — OXYTOCIN 40 UNITS IN NORMAL SALINE INFUSION - SIMPLE MED: 2.5000 [IU]/h | INTRAVENOUS | Status: AC

## 2019-06-02 SURGICAL SUPPLY — 40 items
APL SKNCLS STERI-STRIP NONHPOA (GAUZE/BANDAGES/DRESSINGS) ×1
BENZOIN TINCTURE PRP APPL 2/3 (GAUZE/BANDAGES/DRESSINGS) ×3 IMPLANT
CHLORAPREP W/TINT 26ML (MISCELLANEOUS) ×3 IMPLANT
CLAMP CORD UMBIL (MISCELLANEOUS) IMPLANT
CLOSURE WOUND 1/2 X4 (GAUZE/BANDAGES/DRESSINGS) ×1
CLOTH BEACON ORANGE TIMEOUT ST (SAFETY) ×3 IMPLANT
DRSG OPSITE POSTOP 4X10 (GAUZE/BANDAGES/DRESSINGS) ×3 IMPLANT
ELECT REM PT RETURN 9FT ADLT (ELECTROSURGICAL) ×3
ELECTRODE REM PT RTRN 9FT ADLT (ELECTROSURGICAL) ×1 IMPLANT
EXTRACTOR VACUUM M CUP 4 TUBE (SUCTIONS) IMPLANT
EXTRACTOR VACUUM M CUP 4' TUBE (SUCTIONS)
GLOVE BIOGEL PI IND STRL 7.0 (GLOVE) ×2 IMPLANT
GLOVE BIOGEL PI IND STRL 7.5 (GLOVE) ×2 IMPLANT
GLOVE BIOGEL PI INDICATOR 7.0 (GLOVE) ×4
GLOVE BIOGEL PI INDICATOR 7.5 (GLOVE) ×4
GLOVE ECLIPSE 7.5 STRL STRAW (GLOVE) ×3 IMPLANT
GOWN STRL REUS W/TWL LRG LVL3 (GOWN DISPOSABLE) ×9 IMPLANT
KIT ABG SYR 3ML LUER SLIP (SYRINGE) IMPLANT
NDL HYPO 25X5/8 SAFETYGLIDE (NEEDLE) IMPLANT
NDL SPNL 18GX3.5 QUINCKE PK (NEEDLE) IMPLANT
NEEDLE HYPO 25X5/8 SAFETYGLIDE (NEEDLE) IMPLANT
NEEDLE SPNL 18GX3.5 QUINCKE PK (NEEDLE) ×3 IMPLANT
NS IRRIG 1000ML POUR BTL (IV SOLUTION) ×3 IMPLANT
PACK C SECTION WH (CUSTOM PROCEDURE TRAY) ×3 IMPLANT
PAD OB MATERNITY 4.3X12.25 (PERSONAL CARE ITEMS) ×3 IMPLANT
PENCIL SMOKE EVAC W/HOLSTER (ELECTROSURGICAL) ×3 IMPLANT
RTRCTR C-SECT PINK 25CM LRG (MISCELLANEOUS) ×3 IMPLANT
STRIP CLOSURE SKIN 1/2X4 (GAUZE/BANDAGES/DRESSINGS) ×2 IMPLANT
SUT VIC AB 0 CT1 36 (SUTURE) ×2 IMPLANT
SUT VIC AB 0 CTX 36 (SUTURE) ×9
SUT VIC AB 0 CTX36XBRD ANBCTRL (SUTURE) ×3 IMPLANT
SUT VIC AB 2-0 CT1 27 (SUTURE) ×6
SUT VIC AB 2-0 CT1 TAPERPNT 27 (SUTURE) ×1 IMPLANT
SUT VIC AB 3-0 CT1 27 (SUTURE) ×3
SUT VIC AB 3-0 CT1 TAPERPNT 27 (SUTURE) IMPLANT
SUT VIC AB 4-0 KS 27 (SUTURE) ×3 IMPLANT
SYR CONTROL 10ML LL (SYRINGE) ×2 IMPLANT
TOWEL OR 17X24 6PK STRL BLUE (TOWEL DISPOSABLE) ×3 IMPLANT
TRAY FOLEY W/BAG SLVR 14FR LF (SET/KITS/TRAYS/PACK) ×3 IMPLANT
WATER STERILE IRR 1000ML POUR (IV SOLUTION) ×3 IMPLANT

## 2019-06-02 NOTE — Addendum Note (Signed)
Addendum  created 06/02/19 1707 by Ignacia Bayley, CRNA   Clinical Note Signed

## 2019-06-02 NOTE — Transfer of Care (Signed)
Immediate Anesthesia Transfer of Care Note  Patient: Traci Barron  Procedure(s) Performed: CESAREAN SECTION (N/A )  Patient Location: PACU  Anesthesia Type:Spinal  Level of Consciousness: awake  Airway & Oxygen Therapy: Patient Spontanous Breathing  Post-op Assessment: Report given to RN and Post -op Vital signs reviewed and stable  Post vital signs: stable  Last Vitals:  Vitals Value Taken Time  BP 132/86 06/02/19 1333  Temp    Pulse    Resp 18 06/02/19 1335  SpO2    Vitals shown include unvalidated device data.  Last Pain:  Vitals:   06/02/19 1049  TempSrc: Oral  PainSc: 0-No pain         Complications: No apparent anesthesia complications

## 2019-06-02 NOTE — Discharge Summary (Signed)
Postpartum Discharge Summary  [name] DOB: March 18, 1986 MRN: [id number]  Date of admission: 06/02/2019 Delivering Provider: Clovia Cuff Barron   Date of discharge: 06/05/2019 Admitting diagnosis: IUGR, preeclampsia, and placenta previa Intrauterine pregnancy: [redacted]w[redacted]d     Secondary diagnosis:  Active Problems:   Placenta previa   Status post cesarean section   Post-operative state      Discharge diagnosis: Preterm Pregnancy [facility] course:  Sceduled Barron/S   33 y.o. yo G1P0 at [redacted]w[redacted]d was admitted to the hospital 06/02/2019 for scheduled cesarean section with the following indication: Placenta previa, pre eclampsia Membrane Rupture Time/Date: 1:01 PM ,06/02/2019   Patient delivered a Viable infant.06/02/2019  Details of operation can be found in separate operative note.  Pateint had an uncomplicated postpartum course.  She is ambulating, tolerating a regular diet, passing flatus, and urinating well. Patient is discharged home in stable condition on  06/05/19   She was started on procardia xl 30 qday prior to discharge        Delivery time: 1:01 PM    Magnesium Sulfate received: Yes BMZ received: Yes Rhophylac:Yes  Physical exam  Vitals:   06/04/19 1555 06/04/19 2124 06/04/19 2330 06/05/19 0831  BP: (!) 150/87 140/87 (!) 152/89 (!) 155/94  Pulse: 69 64 65 67  Resp: 18 18 15 16   Temp: 99.3 F (37.4 Barron) 98.3 F (36.8 Barron) 98.2 F (36.8 Barron) 98.8 F (37.1 Barron)  TempSrc: Oral Oral Oral Oral  SpO2: 98% 99% 99% 99%  Weight:      Height:       General: alert Lochia: appropriate Uterine Fundus: nttp Incision: Dressing is clean, dry, and intact DVT Evaluation: No evidence of DVT seen on physical exam. Labs: Lab Results  Component Value Date   WBC 11.9 (H) 06/03/2019   HGB 10.5 (L) 06/03/2019   HCT 30.4 (L) 06/03/2019   MCV 95.0 06/03/2019   PLT 188 06/03/2019   CMP Latest Ref Rng & Units 05/26/2019  Glucose 70 - 99 mg/dL 77  BUN  6 - 20 mg/dL 7  Creatinine 0.44 - 1.00 mg/dL 0.58  Sodium 135 - 145 mmol/L 137  Potassium 3.5 - 5.1 mmol/L 4.0  Chloride 98 - 111 mmol/L 106  CO2 22 - 32 mmol/L 19(L)  Calcium 8.9 - 10.3 mg/dL 9.9  Total Protein 6.5 - 8.1 g/dL 5.8(L)  Total Bilirubin 0.3 - 1.2 mg/dL 0.2(L)  Alkaline Phos 38 - 126 U/L 100  AST 15 - 41 U/L 18  ALT 0 - 44 U/L 13    Discharge instruction: per After Visit Summary and "Baby and Me Booklet".  After visit meds:  Allergies as of 06/05/2019   No Known Allergies     Medication List    TAKE these medications   acetaminophen 500 MG tablet Commonly known as: TYLENOL Take 1,000 mg by mouth every 6 (six) hours as needed for headache.   ibuprofen 600 MG tablet Commonly known as: ADVIL Take 1 tablet (600 mg total) by mouth every 6 (six) hours as needed.   NIFEdipine 30 MG 24 hr tablet Commonly known as: ADALAT CC Take 1 tablet (30 mg total) by mouth daily.   oxyCODONE-acetaminophen 5-325 MG tablet Commonly known as: PERCOCET/ROXICET Take 1-2 tablets by mouth every 4 (four) hours as needed for moderate pain.   polyethylene  glycol 17 g packet Commonly known as: MIRALAX / GLYCOLAX Take 17 g by mouth daily.   prenatal multivitamin Tabs tablet Take 1 tablet by mouth daily.   simethicone 80 MG chewable tablet Commonly known as: MYLICON Chew 1 tablet (80 mg total) by mouth 4 (four) times daily as needed for flatulence.       Diet: routine diet  Activity: Advance as tolerated. Pelvic rest for 6 weeks.   Outpatient follow up:4 weeks Follow up Appt: Future Appointments  Date Time Provider Department Center  06/08/2019  1:45 PM Traci Bossier, MD [facility]   Follow up Visit: Follow-up Information    Traci Bossier, MD. Schedule an appointment as soon as possible for a visit in 4 week(s).   Specialty: Obstetrics and Gynecology Contact information: [address] [place] [address] [phone]        [facility] Traci Barron  [facility]. Go in 1 week(s).   Specialty: Obstetrics and Gynecology Why: blood pressure and incision check Contact information: [address], [address] [place] [address] [phone]           Please schedule this patient for Postpartum visit in: 4 weeks with the following provider: Dr. Marice Barron For Barron/S patients schedule nurse incision check in weeks 2 weeks: no High risk pregnancy complicated by: placenta previa and pre eclampsia Delivery mode:  CS Anticipated Birth Control:  POPs PP Procedures needed: BP check  Schedule Integrated BH visit: no      Newborn Data: Live born female  Birth Weight: 3 lb 8.8 oz (1610 g) APGAR: ,   Newborn Delivery   Birth date/time: 06/02/2019 13:01:00 Delivery type: Barron-Section, Low Transverse Trial of labor: No Barron-section categorization: Primary      Baby Feeding: Breast Disposition:NICU   06/05/2019 Traci Barron Bing, MD

## 2019-06-02 NOTE — Anesthesia Postprocedure Evaluation (Signed)
Anesthesia Post Note  Patient: Traci Barron  Procedure(s) Performed: CESAREAN SECTION (N/A )     Patient location during evaluation: Mother Baby Anesthesia Type: Spinal Level of consciousness: oriented and awake and alert Pain management: pain level controlled Vital Signs Assessment: post-procedure vital signs reviewed and stable Respiratory status: spontaneous breathing and respiratory function stable Cardiovascular status: blood pressure returned to baseline and stable Postop Assessment: no headache, no backache, no apparent nausea or vomiting and able to ambulate Anesthetic complications: no    Last Vitals:  Vitals:   06/02/19 1559 06/02/19 1603  BP: 122/65 135/75  Pulse: (!) 52 (!) 55  Resp: 18   Temp: 36.8 C   SpO2: 100%     Last Pain:  Vitals:   06/02/19 1600  TempSrc:   PainSc: 0-No pain   Pain Goal:                   Arah Aro

## 2019-06-02 NOTE — H&P (Signed)
Faculty Practice H&P  Traci Barron is a 33 y.o. female G39P0 with IUP at [redacted]w[redacted]d presenting for primary cesarean section for low lying placenta and IUGR. Pregnancy was been complicated by IUGR, mild preeclampsia, AMA.    Pt states she has been having no contractions, no vaginal bleeding, intact membranes, with normal fetal movement.     Prenatal Course Source of Care: [facility] with onset of care at [facility]  Pregnancy complications or risks: Patient Active Problem List   Diagnosis Date Noted  . Antepartum mild preeclampsia 05/26/2019  . Low-lying placenta 05/26/2019  . Gestational hypertension affecting first pregnancy 05/23/2019  . Pregnancy affected by fetal growth restriction 04/18/2019  . Abnormal fetal ultrasound 03/24/2019  . Obesity in pregnancy 12/08/2018  . AMA (advanced maternal age) multigravida 35+ 12/08/2018  . Supervision of high risk pregnancy, antepartum 12/05/2018  . Mitral valve disease 12/05/2008   She desires IUD for contraception.  She plans to breastfeed  Prenatal labs and studies: ABO, Rh: --/--/O NEG (10/01 1043) Antibody: POS (10/01 1043) Rubella: 3.23 (04/09 1308) RPR: NON REACTIVE (10/01 1043)  HBsAg: NON-REACTIVE (04/09 1308)  HIV: NON-REACTIVE (08/24 0848)  GBS:    2hr Glucola: negative Genetic screening: normal Anatomy US: normal  Past Medical History:  Past Medical History:  Diagnosis Date  . MVP (mitral valve prolapse)   . Placenta previa 03/24/2019   - posterior    Past Surgical History:  Past Surgical History:  Procedure Laterality Date  . WISDOM TOOTH EXTRACTION      Obstetrical History:  OB History    Gravida  1   Para      Term      Preterm      AB      Living        SAB      TAB      Ectopic      Multiple      Live Births              Gynecological History:  OB History    Gravida  1   Para      Term      Preterm      AB      Living        SAB      TAB      Ectopic      Multiple       Live Births              Social History:  Social History   Socioeconomic History  . Marital status: Married    Spouse name: Not on file  . Number of children: Not on file  . Years of education: Not on file  . Highest education level: Not on file  Occupational History  . Not on file  Social Needs  . Financial resource strain: Not on file  . Food insecurity    Worry: Not on file    Inability: Not on file  . Transportation needs    Medical: Not on file    Non-medical: Not on file  Tobacco Use  . Smoking status: Never Smoker  . Smokeless tobacco: Never Used  Substance and Sexual Activity  . Alcohol use: Not Currently    Comment: occasional  . Drug use: Never  . Sexual activity: Yes  Lifestyle  . Physical activity    Days per week: Not on file    Minutes per session: Not on file  . Stress: Not  on file  Relationships  . Social Musician on phone: Not on file    Gets together: Not on file    Attends religious service: Not on file    Active member of club or organization: Not on file    Attends meetings of clubs or organizations: Not on file    Relationship status: Not on file  Other Topics Concern  . Not on file  Social History Narrative  . Not on file    Family History:  Family History  Problem Relation Age of Onset  . Cancer Maternal Grandfather        great gmother ovarian  . Throat cancer Paternal Grandfather     Medications:  Prenatal vitamins,  Current Facility-Administered Medications  Medication Dose Route Frequency Provider Last Rate Last Dose  . ceFAZolin (ANCEF) IVPB 2g/100 mL premix  2 g Intravenous On Call to OR Traci Heritage, DO      . lactated ringers infusion  125 mL/hr Intravenous Continuous Traci Heritage, DO 125 mL/hr at 06/02/19 1205 125 mL/hr at 06/02/19 1205  . levonorgestrel (LILETTA) 19.5 MCG/DAY IUD   Intrauterine Once Brentlee Delage J, DO        Allergies: No Known Allergies  Review of Systems: -  negative  Physical Exam: Blood pressure 123/78, pulse 92, temperature 98.6 F (37 C), temperature source Oral, resp. rate 18, height 5\' 6"  (1.676 m), weight 113.9 kg, last menstrual period 09/27/2018. GENERAL: Well-developed, well-nourished female in no acute distress.  LUNGS: Clear to auscultation bilaterally.  HEART: Regular rate and rhythm. ABDOMEN: Soft, nontender, nondistended, gravid. EFW 3 lbs EXTREMITIES: Nontender, no edema, 2+ distal pulses. FHT: 141   Pertinent Labs/Studies:   Lab Results  Component Value Date   WBC 14.3 (H) 06/01/2019   HGB 12.3 06/01/2019   HCT 35.0 (L) 06/01/2019   MCV 93.3 06/01/2019   PLT 274 06/01/2019    Assessment : Traci Barron is a 33 y.o. G1P0 at [redacted]w[redacted]d being admitted for cesarean section secondary to low lying placenta, FGR  Plan: The risks of cesarean section discussed with the patient included but were not limited to: bleeding which may require transfusion or reoperation; infection which may require antibiotics; injury to bowel, bladder, ureters or other surrounding organs; injury to the fetus; need for additional procedures including hysterectomy in the event of a life-threatening hemorrhage; placental abnormalities wth subsequent pregnancies, incisional problems, thromboembolic phenomenon and other postoperative/anesthesia complications. The patient concurred with the proposed plan, giving informed written consent for the procedure.   Patient has been NPO since last night and will remain NPO for procedure.  Preoperative prophylactic Ancef ordered on call to the OR.    [redacted]w[redacted]d, DO 06/02/2019, 12:12 PM

## 2019-06-02 NOTE — Anesthesia Postprocedure Evaluation (Signed)
Anesthesia Post Note  Patient: Traci Barron  Procedure(s) Performed: CESAREAN SECTION (N/A )     Patient location during evaluation: PACU Anesthesia Type: Spinal Level of consciousness: oriented and awake and alert Pain management: pain level controlled Vital Signs Assessment: post-procedure vital signs reviewed and stable Respiratory status: spontaneous breathing and respiratory function stable Cardiovascular status: blood pressure returned to baseline and stable Postop Assessment: no headache, no backache, no apparent nausea or vomiting and able to ambulate Anesthetic complications: no    Last Vitals:  Vitals:   06/02/19 1430 06/02/19 1431  BP: (!) 153/78 126/80  Pulse: 60   Resp: 19   Temp: 36.8 C   SpO2: 100%     Last Pain:  Vitals:   06/02/19 1400  TempSrc: Oral  PainSc:    Pain Goal:    LLE Motor Response: Purposeful movement (06/02/19 1430) LLE Sensation: Tingling (06/02/19 1430) RLE Motor Response: Purposeful movement (06/02/19 1430) RLE Sensation: Tingling (06/02/19 1430)     Epidural/Spinal Function Cutaneous sensation: Tingles (06/02/19 1430), Patient able to flex knees: Yes (06/02/19 1430), Patient able to lift hips off bed: No (06/02/19 1430), Back pain beyond tenderness at insertion site: No (06/02/19 1430), Progressively worsening motor and/or sensory loss: No (06/02/19 1430), Bowel and/or bladder incontinence post epidural: No (06/02/19 1430)  Pervis Hocking

## 2019-06-02 NOTE — Op Note (Signed)
06/02/2019  1:33 PM  PATIENT:  Traci Barron  33 y.o. female  PRE-OPERATIVE DIAGNOSIS:  IUGR, preeclampsia, and placenta previa  POST-OPERATIVE DIAGNOSIS:  same  PROCEDURE:  Procedure(s): CESAREAN SECTION (N/A)  SURGEON:  Surgeon(s) and Role:    * Randon Somera, Wilhemina Cash, MD - Primary  ANESTHESIA:   local and spinal  EBL:  510 cc   BLOOD ADMINISTERED:none  DRAINS: Urinary Catheter (Foley)   LOCAL MEDICATIONS USED:  MARCAINE     SPECIMEN:  Source of Specimen:  cord blood  DISPOSITION OF SPECIMEN:  PATHOLOGY  COUNTS:  YES  TOURNIQUET:  * No tourniquets in log *  DICTATION: .Dragon Dictation  PLAN OF CARE: Admit to inpatient   PATIENT DISPOSITION:  PACU - hemodynamically stable.   Delay start of Pharmacological VTE agent (>24hrs) due to surgical blood loss or risk of bleeding: not applicable  The risks, benefits, and alternatives of surgery were explained, understood, accepted. Consents were signed. All questions were answered. In the operating room spinal anesthesia was applied without complication. Her abdomen and vagina were prepped and draped in the usual sterile fashion. A Foley catheter was placed, draining clear urine throughout case. Timeout procedure was done. After adequate anesthesia was assured 30 mL for 0.5% Marcaine was injected into the subcutaneous tissue about 2 cm above the symphysis pubis. An incision was made there. The incision was carried down through the subcutaneous tissue to the fascia. The fascia was scored the midline and extended bilaterally. The middle 50% of the rectus muscles were separated in a transverse fashion using electrosurgical technique. Excellent hemostasis was maintained. The peritoneum was entered with hemostats. Peritoneal incision was extended bilaterally with the Bovie. The bladder blade was placed. A transverse incision was made on the  lower uterine segment. The uterine incision was extended with traction on each side. Amniotomy was performed  with a hemostat. Clear fluid was noted. The baby was delivered from a vertex presentation.the mouth and nostrils were suctioned prior to delivery of the shoulders. The baby's cord was clamped and cut and was transferred to the NICU personnel for care. The placenta was delivered intact with traction. The uterus was left in situ and the interior was cleaned with a dry lap sponge. The uterine incision was closed with 0 Vicryl running locking suture in two layers, with the second layer imbricating the first. Excellent hemostasis was noted. By tilting the uterus each side was able to visualize the adnexa, and they were normal. There was a 4 cm pedunculated fibroid on anterior surface of the uterus. The rectus fascia rectus muscles were noted be hemostatic as well. The fascia was closed with a 0 vicryl suture in a running nonlocking fashion. No defects were palpable. The subcutaneous tissue was irrigated, clean, and dried. A subcuticular closure was done with a 3-0 Vicryl suture.  Excellent cosmetic results were obtained. She was taken to the recovery room in stable condition. She tolerated the procedure well.

## 2019-06-02 NOTE — Addendum Note (Signed)
Addendum  created 06/02/19 1447 by Pervis Hocking, DO   Child order released for a procedure order, Clinical Note Signed, Intraprocedure Blocks edited, Intraprocedure Meds edited

## 2019-06-02 NOTE — Anesthesia Procedure Notes (Signed)
Spinal  Patient location during procedure: OR Start time: 06/02/2019 12:30 PM End time: 06/02/2019 12:40 PM Staffing Anesthesiologist: Pervis Hocking, DO Performed: anesthesiologist  Preanesthetic Checklist Completed: patient identified, surgical consent, pre-op evaluation, timeout performed, IV checked, risks and benefits discussed and monitors and equipment checked Spinal Block Patient position: sitting Prep: site prepped and draped and DuraPrep Patient monitoring: cardiac monitor, continuous pulse ox and blood pressure Approach: midline Location: L3-4 Injection technique: single-shot Needle Needle type: Pencan  Needle gauge: 24 G Needle length: 9 cm Assessment Sensory level: T6 Additional Notes Functioning IV was confirmed and monitors were applied. Sterile prep and drape, including hand hygiene and sterile gloves were used. The patient was positioned and the spine was prepped. The skin was anesthetized with lidocaine.  Free flow of clear CSF was obtained prior to injecting local anesthetic into the CSF.  The spinal needle aspirated freely following injection.  The needle was carefully withdrawn.  The patient tolerated the procedure well.

## 2019-06-03 ENCOUNTER — Encounter (HOSPITAL_COMMUNITY): Payer: Self-pay | Admitting: Obstetrics & Gynecology

## 2019-06-03 LAB — CBC
HCT: 30.4 % — ABNORMAL LOW (ref 36.0–46.0)
Hemoglobin: 10.5 g/dL — ABNORMAL LOW (ref 12.0–15.0)
MCH: 32.8 pg (ref 26.0–34.0)
MCHC: 34.5 g/dL (ref 30.0–36.0)
MCV: 95 fL (ref 80.0–100.0)
Platelets: 188 10*3/uL (ref 150–400)
RBC: 3.2 MIL/uL — ABNORMAL LOW (ref 3.87–5.11)
RDW: 13.5 % (ref 11.5–15.5)
WBC: 11.9 10*3/uL — ABNORMAL HIGH (ref 4.0–10.5)
nRBC: 0 % (ref 0.0–0.2)

## 2019-06-03 LAB — BIRTH TISSUE RECOVERY COLLECTION (PLACENTA DONATION)

## 2019-06-03 MED ORDER — RHO D IMMUNE GLOBULIN 1500 UNIT/2ML IJ SOSY
300.0000 ug | PREFILLED_SYRINGE | Freq: Once | INTRAMUSCULAR | Status: AC
  Administered 2019-06-03: 300 ug via INTRAVENOUS
  Filled 2019-06-03: qty 2

## 2019-06-03 NOTE — Progress Notes (Signed)
Post Partum /Postop Day #1  Subjective: no complaints, up ad lib, voiding and tolerating PO  Objective: Blood pressure (!) 135/95, pulse (!) 55, temperature 97.7 F (36.5 C), temperature source Oral, resp. rate 18, height 5\' 6"  (1.676 m), weight 113.9 kg, last menstrual period 09/27/2018, SpO2 100 %.  Physical Exam:  General: alert Lochia: appropriate Uterine Fundus: firm and NT at umbilicus Honeycomb dressing clean except for a 2 cm area at the bottom midline DVT Evaluation: No evidence of DVT seen on physical exam.  Recent Labs    06/01/19 1043  HGB 12.3  HCT 35.0*    Assessment/Plan: Plan for discharge tomorrow   LOS: 1 day   Emily Filbert 06/03/2019, 6:34 AM

## 2019-06-03 NOTE — Lactation Note (Signed)
This note was copied from a baby's chart. Lactation Consultation Note  [name] [id number] Date: 06/03/2019 Reason for consult: Follow-up assessment;Late-preterm 34-36.6wks;Infant < 6lbs;NICU baby    Mom requested LC to come and observe pump.  She and dad wanted to ensure they were using pump correctly.  When observing mom pump, right nipple and large amount of areola tissue was entering flange tunnel.  She was using size 27 and 24. The left nipple was entering the flange along with some areola tissue.  Both sides appeared to need a smaller flange.  LC replaced the 24 with 21.  Mom felt no difference in comfort with the 21 than the 24 but EBM did seem to increase when size was decreased.  Nipple on the left side did increase in size at the end of pumping session but mom denied discomfort.  Nipple was still able to move freely in flange.    LC suggested pumping with 21 flange, and if discomfort felt on left side, increase to 24.  Suggested paying attention to nipples after pumping to note any redness.  LC provided coconut oil for mom to use during pumping or on nipples if needed.  Parents excited to collect more EBM.  EBM placed in colostrum container and labeled and put in frig.  LC provided bins, soap, and brush to take upstairs incase she pumps in NICU.  LC suggested pumping after STS or feed attempt with infant.  All questions answered.  Reviewed NICU booklet with family.    Maternal Data    Feeding Feeding Type: Donor Breast Milk  LATCH Score                   Interventions Interventions: Hand express;DEBP;Expressed milk  Lactation Tools Discussed/Used Tools: Flanges Flange Size: 21 Pump Review: Setup, frequency, and cleaning;Milk Storage   Consult Status Consult Status: Follow-up Date: 06/04/19 Follow-up type: In-patient    [facility] 06/03/2019, 11:29 PM

## 2019-06-03 NOTE — Lactation Note (Signed)
This note was copied from a baby's chart. Lactation Consultation Note  [name] TKPTW'S Date: 06/03/2019 Reason for consult: Initial assessment;NICU baby;1st time breastfeeding;Infant < 6lbs;Late-preterm 34-36.6wks;Primapara  Initial visit with P1 mom who delivered @ 34 wks, baby is now 59 hours old and in NICU. Mom states she is pumping for 15 minutes every 3 hours. She is getting some colostrum out and transferring it to the snappy containers and taking it to the NICU. Mom states she viewed demonstration of hand expression in virtual childbirth class. Mom states she recently pumped <1 hour ago. Pump previously set up by [facility] staff.  Mom reports breast changes during her pregnancy such as areola darkening and breast size increasing. Mom denies any pain or discomfort with pumping. Mom states her nipple fits like a glove in the flange during pumping. Nipple noted to be slightly red and erect at rest. Encouraged mom to use 55mm flange with next pumping session @ 1315. Informed mom that she may pump for 20 minutes if desired. Mom using the initiation setting but not increasing the suction strength. Instructed mom to increase the suction strength until uncomfortable and then turn back 1-2 levels. Praised mom for dedication to pumping. NICU booklet noted at bedside and mom states RN has been reviewing information with her.  Hand expression demonstrated by LC with no colostrum expressed. Reassured mom that this could be normal as she recently pumped.  Reminded mom about disassembly and cleaning of pump parts after each use. Pump pieces noted to be soaking at sink and intact. Demonstrated disassembly and discouraged from soaking longer than 5-10 minutes.  Encouraged mom to perform STS as allowed.  Parents notified of IP/OP lactation services and lactation brochure with phone number left at bedside. Notified that NICU RN and/or LC will be available to assist with latching once  permitted.  Maternal Data Has patient been taught Hand Expression?: Yes(by [facility]) Does the patient have breastfeeding experience prior to this delivery?: No  Interventions Interventions: Breast feeding basics reviewed;Skin to skin;Breast massage;DEBP;Hand express;Expressed milk  Lactation Tools Discussed/Used WIC Program: No Pump Review: Setup, frequency, and cleaning;Milk Storage Initiated by:: [facility] staff   Consult Status Consult Status: Follow-up Date: 06/04/19 Follow-up type: In-patient    Cranston Neighbor 06/03/2019, 11:31 AM

## 2019-06-03 NOTE — Lactation Note (Signed)
This note was copied from a baby's chart. Lactation Consultation Note  [name] MIWOE'H Date: 06/03/2019 Reason for consult: Follow-up assessment;Initial assessment    1st attempt at breast for NICU baby.  Cueing, then mom put infant STS and infant fell asleep.  [place] worked with mom to hand express, glisten seen.  5 minutes after STS, tried hand exp. Again and a drop was easily expressed.  Baby licked then immediately fell asleep.    NS #20 applied and mom returned demonstration of application.  LC reviewed purpose of shield.    Mom has been pumping every three hours.  LC encouraged her to come and do STS as often as she desired; benefits discussed.  Also discussed pumping after STS.    Mom desires [place] visit in moms room due to pump questions during next pumping session.    Families questions answered.  [place] praised mom for efforts and pumping and encouraged mom to continue to attempt with bf when infant cues.     Maternal Data Has patient been taught Hand Expression?: Yes Does the patient have breastfeeding experience prior to this delivery?: No  Feeding Feeding Type: Breast Fed  LATCH Score Latch: Too sleepy or reluctant, no latch achieved, no sucking elicited.  Audible Swallowing: None  Type of Nipple: Everted at rest and after stimulation  Comfort (Breast/Nipple): Soft / non-tender  Hold (Positioning): Assistance needed to correctly position infant at breast and maintain latch.  LATCH Score: 5  Interventions Interventions: Breast feeding basics reviewed;Skin to skin;Hand express;Position options;Support pillows  Lactation Tools Discussed/Used Tools: Nipple Shields Nipple shield size: 20   Consult Status Consult Status: Follow-up Date: 06/04/19 Follow-up type: In-patient    Ferne Coe [facility] 06/03/2019, 5:47 PM

## 2019-06-04 MED ORDER — SENNOSIDES-DOCUSATE SODIUM 8.6-50 MG PO TABS: 2.0000 | ORAL_TABLET | Freq: Every evening | ORAL | Status: DC | PRN

## 2019-06-04 NOTE — Progress Notes (Signed)
Subjective: Postpartum Day 2: Cesarean Delivery Patient reports feeling well. She is ambulating, voiding and tolerating a regular diet    Objective: Vital signs in last 24 hours: Temp:  [97.9 F (36.6 C)-98.6 F (37 C)] 98.5 F (36.9 C) (10/04 0634) Pulse Rate:  [51-62] 62 (10/04 0634) Resp:  [15-18] 17 (10/04 0634) BP: (120-139)/(74-85) 127/81 (10/04 0634) SpO2:  [97 %-99 %] 98 % (10/04 9678)  Physical Exam:  General: alert, cooperative and no distress Lochia: appropriate Uterine Fundus: firm Incision: honeycomb dressing intact with unchanged dry blood pattern on inferior aspect of the incision DVT Evaluation: No evidence of DVT seen on physical exam.  Recent Labs    06/01/19 1043 06/03/19 0931  HGB 12.3 10.5*  HCT 35.0* 30.4*    Assessment/Plan: Status post Cesarean section. Doing well postoperatively.  Continue current care Patient is interested in possible discharge home later today but will discuss with her husband first.  Traci Barron 06/04/2019, 7:29 AM

## 2019-06-04 NOTE — Lactation Note (Signed)
This note was copied from a baby's chart. Lactation Consultation Note  [name] RCVEL'F Date: 06/04/2019 Reason for consult: Follow-up assessment;Primapara;1st time breastfeeding;NICU baby;Infant < 6lbs;Late-preterm 34-36.6wks;Hyperbilirubinemia  LC in to visit P1 Mom of [redacted]w[redacted]d baby in the NICU.  Baby at 71 hrs old and weighs 3 lbs 5 oz.    Baby is currently in isolette due to low temps earlier this am.    Mom is pumping both breasts using the [employer] DEBP every 2-3 hrs, and collecting colostrum and bringing to her baby.   Baby has been attempting breastfeeding due to rooting behavior.  Baby with suck and lick, but hasn't sustained a latch to the breast.  Talked with parents about normal preterm breastfeeding behavior.  Reassured them that baby was behaving normal and appropriate.  Encouraged STS when she can, and frequent continued pumping.  Baby will need supplementation through gavage and then paced bottle feedings for a few weeks.  Reinforced the importance of regular pumping, as a full milk supply will make the transition to direct breastfeeding much easier.  Interventions Interventions: DEBP  Lactation Tools Discussed/Used Tools: Pump Breast pump type: Double-Electric Breast Pump   Consult Status Consult Status: Follow-up Date: 06/05/19 Follow-up type: In-patient    [name] 06/04/2019, 3:01 PM

## 2019-06-04 NOTE — Plan of Care (Signed)
Patient progressing With pain control,Passing flatus,tolerating a Regular diet and pumping.

## 2019-06-05 LAB — TYPE AND SCREEN
ABO/RH(D): O NEG
Antibody Screen: POSITIVE
Unit division: 0
Unit division: 0

## 2019-06-05 LAB — BPAM RBC
Blood Product Expiration Date: 202011022359
Blood Product Expiration Date: 202011032359
Unit Type and Rh: 9500
Unit Type and Rh: 9500

## 2019-06-05 LAB — RH IG WORKUP (INCLUDES ABO/RH)
ABO/RH(D): O NEG
Fetal Screen: NEGATIVE
Gestational Age(Wks): 34.2
Unit division: 0

## 2019-06-05 MED ORDER — SIMETHICONE 80 MG PO CHEW: 80.0000 mg | CHEWABLE_TABLET | Freq: Four times a day (QID) | ORAL | 0 refills | Status: AC | PRN

## 2019-06-05 MED ORDER — NIFEDIPINE ER 30 MG PO TB24: 30.0000 mg | ORAL_TABLET | Freq: Every day | ORAL | 0 refills | Status: AC

## 2019-06-05 MED ORDER — POLYETHYLENE GLYCOL 3350 17 G PO PACK: 17.0000 g | PACK | Freq: Every day | ORAL | 0 refills | Status: AC

## 2019-06-05 MED ORDER — IBUPROFEN 600 MG PO TABS: 600.0000 mg | ORAL_TABLET | Freq: Four times a day (QID) | ORAL | 0 refills | Status: AC | PRN

## 2019-06-05 MED ORDER — NIFEDIPINE ER OSMOTIC RELEASE 30 MG PO TB24
30.0000 mg | ORAL_TABLET | Freq: Every day | ORAL | Status: DC
  Administered 2019-06-05: 30 mg via ORAL
  Filled 2019-06-05: qty 1

## 2019-06-05 MED ORDER — OXYCODONE-ACETAMINOPHEN 5-325 MG PO TABS: 1.0000 | ORAL_TABLET | ORAL | 0 refills | Status: AC | PRN

## 2019-06-05 NOTE — Progress Notes (Signed)
Error

## 2019-06-05 NOTE — Progress Notes (Signed)
Discharge teaching complete with pt. Pt understood all information and did not have any questions. Pt discharged home with family.  

## 2019-06-05 NOTE — Discharge Instructions (Signed)
Cesarean Delivery, Care After This sheet gives you information about how to care for yourself after your procedure. Your health care provider may also give you more specific instructions. If you have problems or questions, contact your health care provider. What can I expect after the procedure? After the procedure, it is common to have:  A small amount of blood or clear fluid coming from the incision.  Some redness, swelling, and pain in your incision area.  Some abdominal pain and soreness.  Vaginal bleeding (lochia). Even though you did not have a vaginal delivery, you will still have vaginal bleeding and discharge.  Pelvic cramps.  Fatigue. You may have pain, swelling, and discomfort in the tissue between your vagina and your anus (perineum) if:  Your C-section was unplanned, and you were allowed to labor and push.  An incision was made in the area (episiotomy) or the tissue tore during attempted vaginal delivery. Follow these instructions at home: Incision care   Follow instructions from your health care provider about how to take care of your incision. Make sure you: ? Wash your hands with soap and water before you change your bandage (dressing). If soap and water are not available, use hand sanitizer. ? If you have a dressing, change it or remove it as told by your health care provider. ? Leave stitches (sutures), skin staples, skin glue, or adhesive strips in place. These skin closures may need to stay in place for 2 weeks or longer. If adhesive strip edges start to loosen and curl up, you may trim the loose edges. Do not remove adhesive strips completely unless your health care provider tells you to do that.  Check your incision area every day for signs of infection. Check for: ? More redness, swelling, or pain. ? More fluid or blood. ? Warmth. ? Pus or a bad smell.  Do not take baths, swim, or use a hot tub until your health care provider says it's okay. Ask your health  care provider if you can take showers.  When you cough or sneeze, hug a pillow. This helps with pain and decreases the chance of your incision opening up (dehiscing). Do this until your incision heals. Medicines  Take over-the-counter and prescription medicines only as told by your health care provider.  If you were prescribed an antibiotic medicine, take it as told by your health care provider. Do not stop taking the antibiotic even if you start to feel better.  Do not drive or use heavy machinery while taking prescription pain medicine. Lifestyle  Do not drink alcohol. This is especially important if you are breastfeeding or taking pain medicine.  Do not use any products that contain nicotine or tobacco, such as cigarettes, e-cigarettes, and chewing tobacco. If you need help quitting, ask your health care provider. Eating and drinking  Drink at least 8 eight-ounce glasses of water every day unless told not to by your health care provider. If you breastfeed, you may need to drink even more water.  Eat high-fiber foods every day. These foods may help prevent or relieve constipation. High-fiber foods include: ? Whole grain cereals and breads. ? Brown rice. ? Beans. ? Fresh fruits and vegetables. Activity   If possible, have someone help you care for your baby and help with household activities for at least a few days after you leave the hospital.  Return to your normal activities as told by your health care provider. Ask your health care provider what activities are safe for  you.  Rest as much as possible. Try to rest or take a nap while your baby is sleeping.  Do not lift anything that is heavier than 10 lbs (4.5 kg), or the limit that you were told, until your health care provider says that it is safe.  Talk with your health care provider about when you can engage in sexual activity. This may depend on your: ? Risk of infection. ? How fast you heal. ? Comfort and desire to  engage in sexual activity. General instructions  Do not use tampons or douches until your health care provider approves.  Wear loose, comfortable clothing and a supportive and well-fitting bra.  Keep your perineum clean and dry. Wipe from front to back when you use the toilet.  If you pass a blood clot, save it and call your health care provider to discuss. Do not flush blood clots down the toilet before you get instructions from your health care provider.  Keep all follow-up visits for you and your baby as told by your health care provider. This is important. Contact a health care provider if:  You have: ? A fever. ? Bad-smelling vaginal discharge. ? Pus or a bad smell coming from your incision. ? Difficulty or pain when urinating. ? A sudden increase or decrease in the frequency of your bowel movements. ? More redness, swelling, or pain around your incision. ? More fluid or blood coming from your incision. ? A rash. ? Nausea. ? Little or no interest in activities you used to enjoy. ? Questions about caring for yourself or your baby.  Your incision feels warm to the touch.  Your breasts turn red or become painful or hard.  You feel unusually sad or worried.  You vomit.  You pass a blood clot from your vagina.  You urinate more than usual.  You are dizzy or light-headed. Get help right away if:  You have: ? Pain that does not go away or get better with medicine. ? Chest pain. ? Difficulty breathing. ? Blurred vision or spots in your vision. ? Thoughts about hurting yourself or your baby. ? New pain in your abdomen or in one of your legs. ? A severe headache.  You faint.  You bleed from your vagina so much that you fill more than one sanitary pad in one hour. Bleeding should not be heavier than your heaviest period. Summary  After the procedure, it is common to have pain at your incision site, abdominal cramping, and slight bleeding from your vagina.  Check  your incision area every day for signs of infection.  Tell your health care provider about any unusual symptoms.  Keep all follow-up visits for you and your baby as told by your health care provider. This information is not intended to replace advice given to you by your health care provider. Make sure you discuss any questions you have with your health care provider. Document Released: 05/09/2002 Document Revised: 02/23/2018 Document Reviewed: 02/23/2018 Elsevier Patient Education  2020 [employer].  Hypertension During Pregnancy Hypertension is also called high blood pressure. High blood pressure means that the force of your blood moving in your body is too strong. It can cause problems for you and your baby. Different types of high blood pressure can happen during pregnancy. The types are:  High blood pressure before you got pregnant. This is called chronic hypertension.  This can continue during your pregnancy. Your doctor will want to keep checking your blood pressure. You may  need medicine to keep your blood pressure under control while you are pregnant. You will need follow-up visits after you have your baby.  High blood pressure that goes up during pregnancy when it was normal before. This is called gestational hypertension. It will usually get better after you have your baby, but your doctor will need to watch your blood pressure to make sure that it is getting better.  Very high blood pressure during pregnancy. This is called preeclampsia. Very high blood pressure is an emergency that needs to be checked and treated right away.  You may develop very high blood pressure after giving birth. This is called postpartum preeclampsia. This usually occurs within 48 hours after childbirth but may occur up to 6 weeks after giving birth. This is rare. How does this affect me? If you have high blood pressure during pregnancy, you have a higher chance of developing high blood pressure:  As you  get older.  If you get pregnant again. In some cases, high blood pressure during pregnancy can cause:  Stroke.  Heart attack.  Damage to the kidneys, lungs, or liver.  Preeclampsia.  Jerky movements you cannot control (convulsions or seizures).  Problems with the placenta.   What can I do to lower my risk?   Keep a healthy weight.  Eat a healthy diet.  Follow what your doctor tells you about treating any medical problems that you had before becoming pregnant. It is very important to go to all of your doctor visits. Your doctor will check your blood pressure and make sure that your pregnancy is progressing as it should. Treatment should start early if a problem is found.   Follow these instructions at home: Eating and drinking   Drink enough fluid to keep your pee (urine) pale yellow.  Avoid caffeine. Lifestyle  Do not use any products that contain nicotine or tobacco, such as cigarettes, e-cigarettes, and chewing tobacco. If you need help quitting, ask your doctor.  Do not use alcohol or drugs.  Avoid stress.  Rest and get plenty of sleep.  Regular exercise can help. Ask your doctor what kinds of exercise are best for you. General instructions  Take over-the-counter and prescription medicines only as told by your doctor.  Keep all prenatal and follow-up visits as told by your doctor. This is important. Contact a doctor if:  You have symptoms that your doctor told you to watch for, such as: ? Headaches. ? Nausea. ? Vomiting. ? Belly (abdominal) pain. ? Dizziness. ? Light-headedness. Get help right away if:  You have: ? Very bad belly pain that does not get better with treatment. ? A very bad headache that does not get better. ? Vomiting that does not get better. ? Sudden, fast weight gain. ? Sudden swelling in your hands, ankles, or face. ? Blood in your pee. ? Blurry vision. ? Double vision. ? Shortness of breath. ? Chest pain. ? Weakness on  one side of your body. ? Trouble talking. Summary  High blood pressure is also called hypertension.  High blood pressure means that the force of your blood moving in your body is too strong.  High blood pressure can cause problems for you and your baby.  Keep all follow-up visits as told by your doctor. This is important. This information is not intended to replace advice given to you by your health care provider. Make sure you discuss any questions you have with your health care provider. Document Released: 09/19/2010 Document Revised: 12/08/2018  Document Reviewed: 09/13/2018 Elsevier Patient Education  [employer]2020 Else[employer] Inc[employer]

## 2019-06-05 NOTE — Progress Notes (Signed)
Patient screened out for psychosocial assessment since none of the following apply: °Psychosocial stressors documented in mother or baby's chart °Gestation less than 32 weeks °Code at delivery  °Infant with anomalies °Please contact the Clinical Social Worker if specific needs arise, by MOB's request, or if MOB scores greater than 9/yes to question 10 on Edinburgh Postpartum Depression Screen. ° °Laylanie Kruczek Boyd-Gilyard, MSW, LCSW °Clinical Social Work °[phone] °  °

## 2019-06-05 NOTE — Lactation Note (Signed)
This note was copied from a baby's chart. Lactation Consultation Note  [name] YYTKP'T Date: 06/05/2019  Infant is 54 hrs old. Mom is getting discharged now. Mom denies having any questions for me. The pump she ordered through insurance has not been shipped, yet. I gave her the phone # to call [employer] to rent a DEBP.   Mom is taking nifedipine 30 mg (L2).   Matthias Hughs [facility] 06/05/2019, 9:45 AM

## 2019-06-06 ENCOUNTER — Ambulatory Visit (HOSPITAL_COMMUNITY): Payer: PRIVATE HEALTH INSURANCE

## 2019-06-08 ENCOUNTER — Other Ambulatory Visit: Payer: Self-pay

## 2019-06-08 ENCOUNTER — Encounter: Payer: Self-pay | Admitting: Obstetrics & Gynecology

## 2019-06-08 ENCOUNTER — Ambulatory Visit (INDEPENDENT_AMBULATORY_CARE_PROVIDER_SITE_OTHER): Payer: PRIVATE HEALTH INSURANCE | Admitting: Obstetrics & Gynecology

## 2019-06-08 VITALS — BP 141/95 | HR 87 | Resp 16 | Ht 66.0 in | Wt 238.0 lb

## 2019-06-08 DIAGNOSIS — Z9889 Other specified postprocedural states: Secondary | ICD-10-CM

## 2019-06-08 NOTE — Progress Notes (Signed)
   Subjective:    Patient ID: Traci Barron, female    DOB: July 15, 1986, 33 y.o.   MRN: [id number]  HPI 33 yo P1 here for an incision and BP check a week after a PLTCS for severe preeclampsia and placenta previa at 34 weeks. She was sent home on procardia but she found that it gave her a severe headache so she stopped it. She has a BP cuff at home and has been finding her pressures to generally be in the 130s/80s. She has no symptoms of pre-e.  She denies baby blues and reports normal bowel and bladder function.  The baby is a "feeder/grower" in the NICU.  Review of Systems     Objective:   Physical Exam Breathing, conversing, and ambulating normally Well nourished, well hydrated White female, no apparent distress Abd- benign Incision- healed well     Assessment & Plan:  Post op- doing well Continue weekly BP checks and please forward them to the office, no meds at this point Come back for pp visit in about 5 weeks, prn sooner

## 2019-06-09 ENCOUNTER — Ambulatory Visit (HOSPITAL_COMMUNITY): Payer: PRIVATE HEALTH INSURANCE

## 2019-06-12 ENCOUNTER — Ambulatory Visit: Payer: Self-pay

## 2019-06-12 NOTE — Lactation Note (Signed)
This note was copied from a baby's chart. Lactation Consultation Note  [name] HMCNO'B Date: 06/12/2019  [name] is 75 days old in the NICU.  Mom states she is pumping every 3 hours and obtaining 4-6 ounces.  Breasts are comfortable.  Encouraged to call with concerns prn.   Maternal Data    Feeding Feeding Type: Breast Milk Nipple Type: Nfant Extra Slow Flow (gold)  LATCH Score                   Interventions    Lactation Tools Discussed/Used     Consult Status      Ave Filter 06/12/2019, 11:58 AM

## 2019-06-30 ENCOUNTER — Telehealth: Payer: PRIVATE HEALTH INSURANCE | Admitting: Obstetrics and Gynecology

## 2019-06-30 ENCOUNTER — Telehealth: Payer: Self-pay | Admitting: *Deleted
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
## 2019-07-04 ENCOUNTER — Encounter: Payer: Self-pay | Admitting: *Deleted

## 2019-07-04 ENCOUNTER — Telehealth: Payer: PRIVATE HEALTH INSURANCE | Admitting: Advanced Practice Midwife

## 2020-07-08 IMAGING — US US MFM UA CORD DOPPLER
1 series · 14 of 24 positions shown · non-contrast
Comparison: none

[Series 1: us mfm ua cord doppler · 24 acquisitions, 14 frames shown]
[im 1/24]
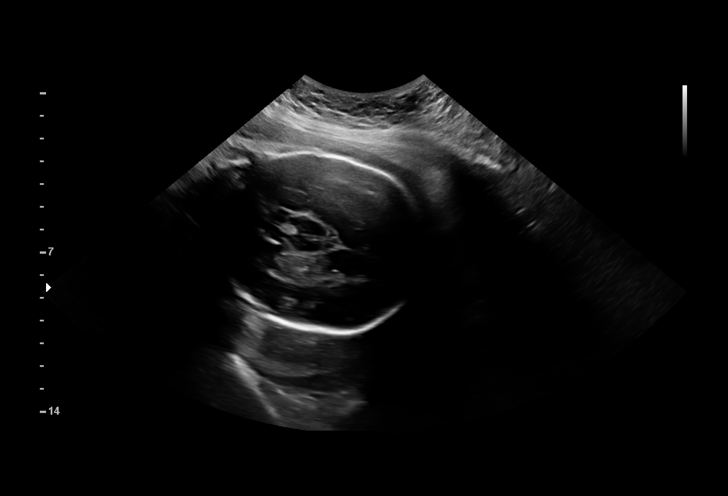
[im 3/24]
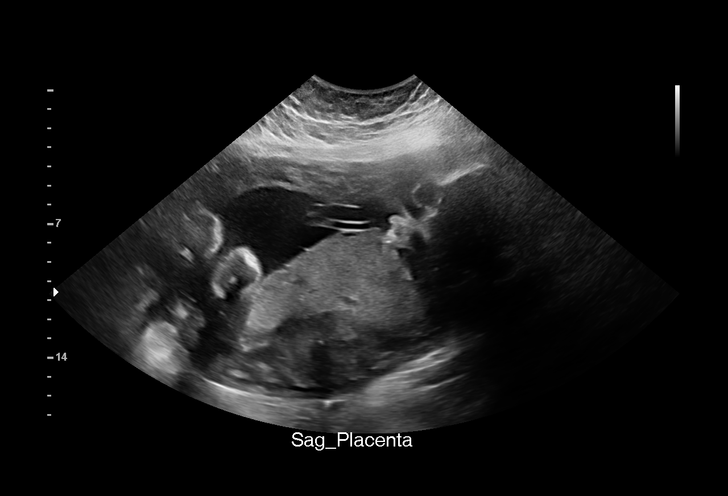
[im 5/24]
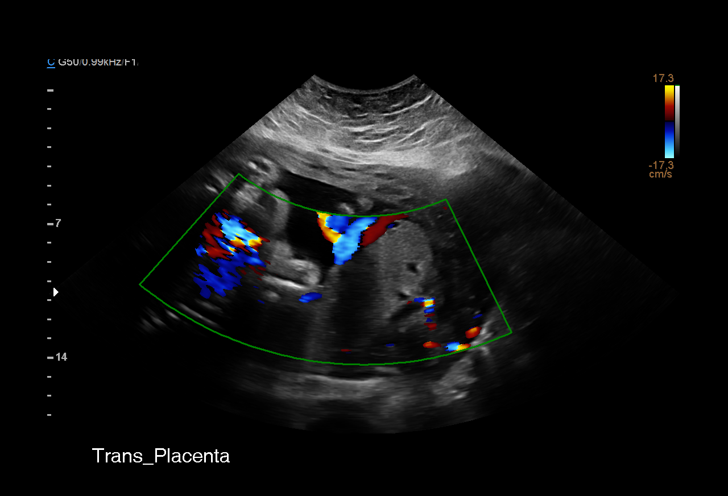
[im 7/24]
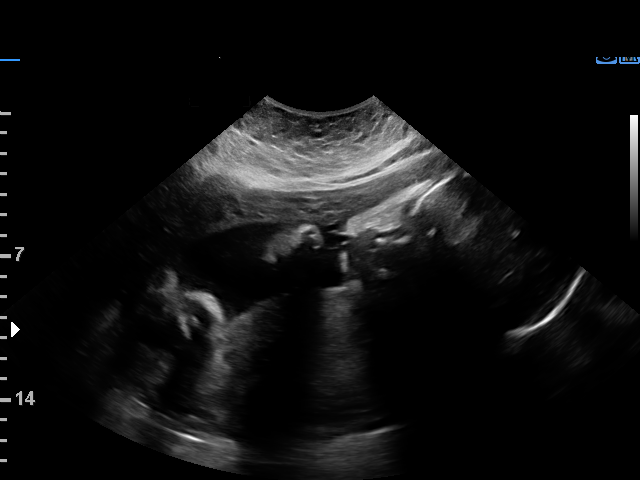
[im 8/24]
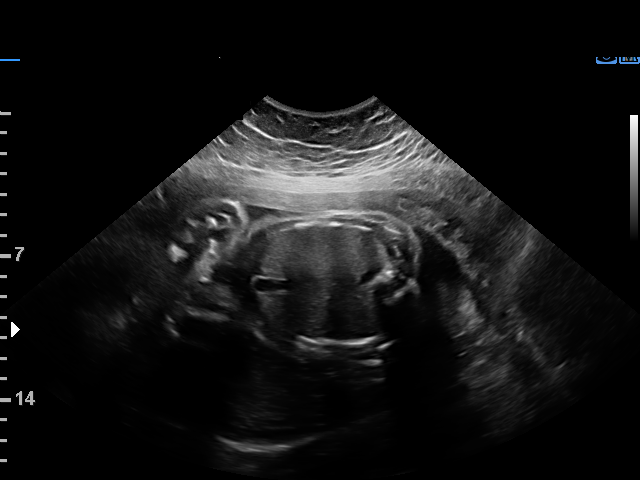
[im 10/24]
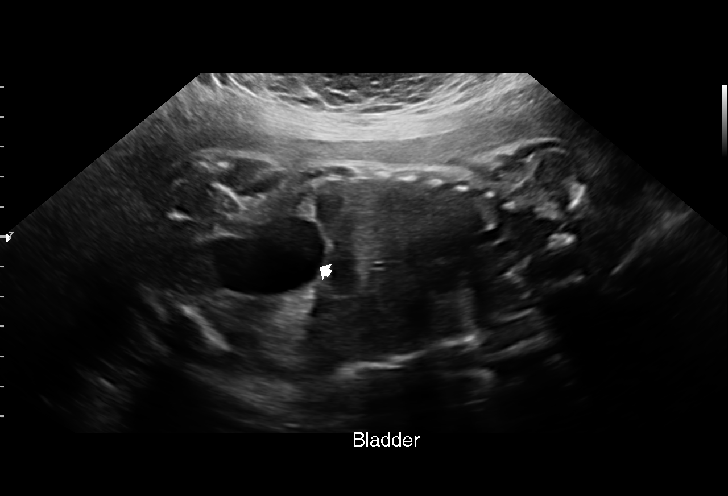
[im 12/24]
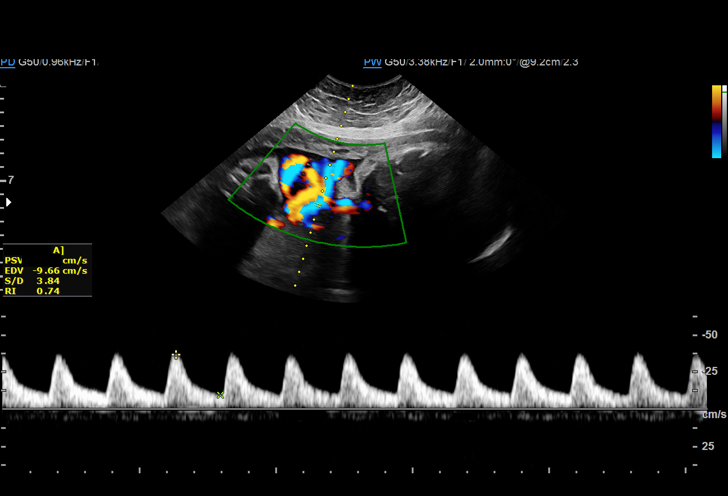
[im 13/24]
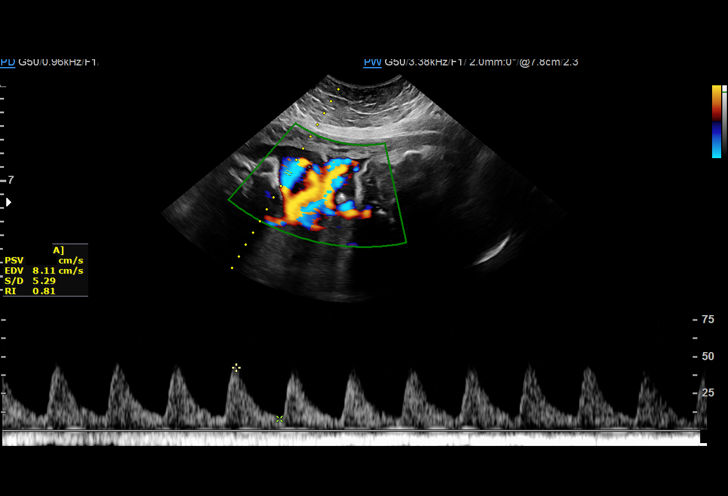
[im 15/24]
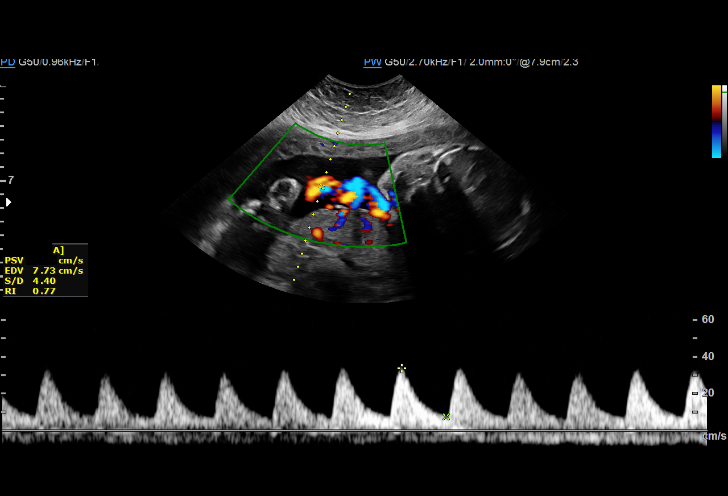
[im 17/24]
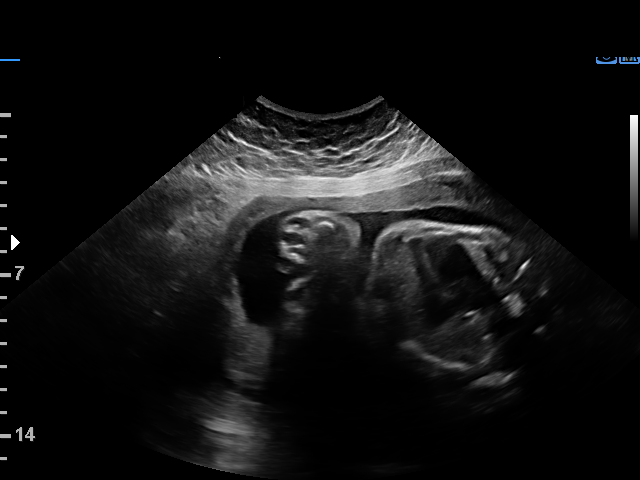
[im 19/24]
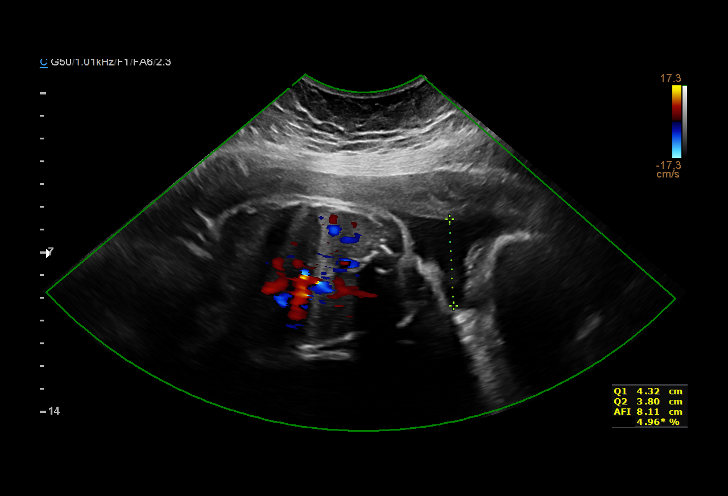
[im 20/24]
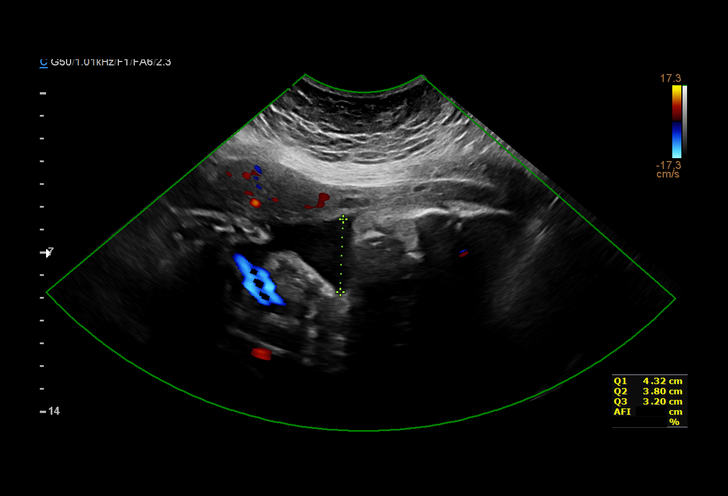
[im 22/24]
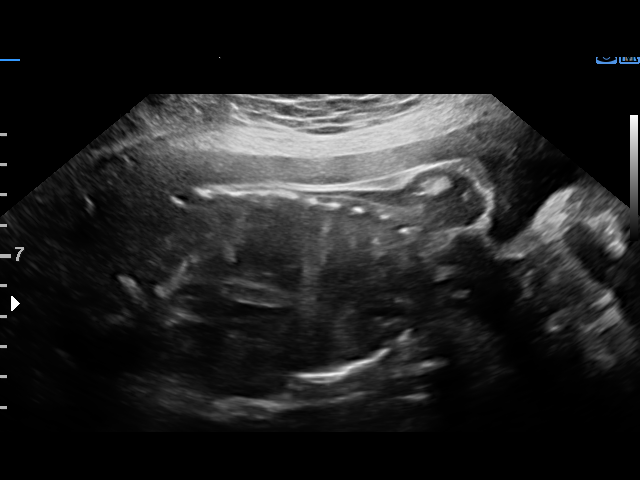
[im 24/24]
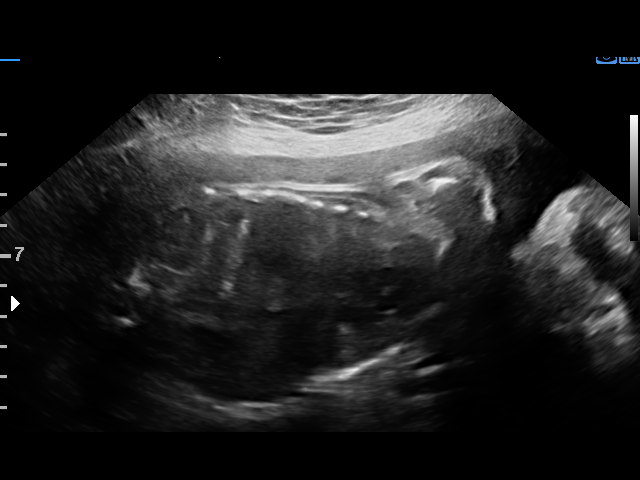

[14 of 24 positions shown; findings below may reference images not displayed]

1  US MFM UA CORD DOPPLER               76820.02     HONA-BESI
                                                       NAKEISHA
     RUDI/NONSTRESS                                       NAKEISHA
 ----------------------------------------------------------------------

 ----------------------------------------------------------------------
Indications

  Small for gestational age fetus affecting
  management of mother
  Advanced maternal age multigravida 35+,
  third trimester
  Uterine fibroids
  Abnormal finding on antenatal screening
  (shortened long bones)
  Heart disease in mother affecting pregnancy
  in third trimester
  Low lying placenta, antepartum (previously a
  previa now 1.3-1.8 cm)
  33 weeks gestation of pregnancy
 ----------------------------------------------------------------------
Vital Signs

                                                Height:        5'6"
Fetal Evaluation

 Num Of Fetuses:         1
 Fetal Heart Rate(bpm):  145
 Cardiac Activity:       Observed
 Presentation:           Cephalic
 Placenta:               Posterior
 P. Cord Insertion:      Previously Visualized
 Amniotic Fluid
 AFI FV:      Within normal limits

 AFI Sum(cm)     %Tile       Largest Pocket(cm)
 15.39           55

 RUQ(cm)       RLQ(cm)       LUQ(cm)        LLQ(cm)

Biophysical Evaluation

 Amniotic F.V:   Within normal limits       F. Tone:        Observed
 F. Movement:    Observed                   N.S.T:          Reactive
 F. Breathing:   Observed                   Score:          [DATE]
OB History

 Gravidity:    1         Term:   0        Prem:   0        SAB:   0
 TOP:          0       Ectopic:  0        Living: 0
Gestational Age

 LMP:           35w 0d        Date:  09/27/18                 EDD:   07/04/19
 Best:          33w 6d     Det. By:  Early Ultrasound         EDD:   07/12/19
                                     (12/08/18)
Doppler - Fetal Vessels

 Umbilical Artery
  S/D     %tile                                            ADFV    RDFV
 4.61    > 97.5                                               No      No

Comments

 This patient was seen for a biophysical profile and umbilical
 artery Doppler studies due to an IUGR fetus.  Over the past 2
 weeks, the patient's blood pressures have slowly been
 increasing.  She was evaluated in the hospital about 4 days
 ago due to a severe headache.  Her PIH labs at that time
 were within normal limits.  Her P/C ratio was 0.36 indicating
 that she has developed preeclampsia.  The patient reports
 that her headache has since resolved.  She reports feeling
 vigorous fetal movements throughout the day.  Her initial
 blood pressure today was 133/101. A repeat blood pressure
 was 141/95.

 A biophysical profile performed today was [DATE].  There
 was normal amniotic fluid noted.  The patient had a reactive
 nonstress test today.

 Doppler studies of the umbilical arteries performed today
 continues to show an elevated S/D ratio of 4.61.

 Due to preeclampsia in the setting of an IUGR fetus along
 with her elevated blood pressures, I would recommend that
 she be delivered at between 34 to 35 weeks.  We will plan on
 administering a rescue course of steroids starting today.  Due
 to placenta previa, the patient understands that a cesarean
 delivery will be scheduled for her.

 Preeclampsia precautions were discussed.  She should
 continue fetal testing until delivery.
